# Patient Record
Sex: Female | Born: 1960 | Race: Black or African American | Hispanic: No | Marital: Single | Smoking: Current every day smoker
Health system: Southern US, Community
[De-identification: ages and names within clinical notes are randomized; demographics above are authoritative.]

## PROBLEM LIST (undated history)

## (undated) DIAGNOSIS — Z9889 Other specified postprocedural states: Secondary | ICD-10-CM

## (undated) DIAGNOSIS — R112 Nausea with vomiting, unspecified: Secondary | ICD-10-CM

## (undated) DIAGNOSIS — I10 Essential (primary) hypertension: Secondary | ICD-10-CM

## (undated) DIAGNOSIS — E7800 Pure hypercholesterolemia, unspecified: Secondary | ICD-10-CM

## (undated) HISTORY — PX: CHOLECYSTECTOMY: SHX55

## (undated) HISTORY — PX: ABDOMINAL HYSTERECTOMY: SHX81

## (undated) HISTORY — PX: TONSILLECTOMY: SUR1361

---

## 2011-08-20 ENCOUNTER — Emergency Department: Payer: Self-pay | Admitting: Emergency Medicine

## 2011-10-08 ENCOUNTER — Emergency Department: Payer: Self-pay | Admitting: Emergency Medicine

## 2011-10-08 LAB — CBC WITH DIFFERENTIAL/PLATELET
Basophil %: 0.6 %
Eosinophil #: 0.2 10*3/uL (ref 0.0–0.7)
Eosinophil %: 4.1 %
HCT: 39.7 % (ref 35.0–47.0)
HGB: 13.3 g/dL (ref 12.0–16.0)
Lymphocyte #: 1.1 10*3/uL (ref 1.0–3.6)
MCH: 29.8 pg (ref 26.0–34.0)
MCHC: 33.5 g/dL (ref 32.0–36.0)
MCV: 89 fL (ref 80–100)
Monocyte #: 1.1 x10 3/mm — ABNORMAL HIGH (ref 0.2–0.9)
Neutrophil #: 2.8 10*3/uL (ref 1.4–6.5)
Neutrophil %: 53.4 %
Platelet: 239 10*3/uL (ref 150–440)
RBC: 4.46 10*6/uL (ref 3.80–5.20)
RDW: 13.1 % (ref 11.5–14.5)

## 2011-10-08 LAB — COMPREHENSIVE METABOLIC PANEL
Albumin: 3.9 g/dL (ref 3.4–5.0)
Anion Gap: 9 (ref 7–16)
BUN: 12 mg/dL (ref 7–18)
Bilirubin,Total: 0.6 mg/dL (ref 0.2–1.0)
Co2: 23 mmol/L (ref 21–32)
Creatinine: 0.74 mg/dL (ref 0.60–1.30)
EGFR (African American): >60
Glucose: 93 mg/dL (ref 65–99)
Potassium: 3.5 mmol/L (ref 3.5–5.1)
SGPT (ALT): 33 U/L

## 2012-02-19 ENCOUNTER — Emergency Department: Payer: Self-pay | Admitting: Emergency Medicine

## 2015-02-05 ENCOUNTER — Ambulatory Visit
Admission: RE | Admit: 2015-02-05 | Discharge: 2015-02-05 | Disposition: A | Discharge status: Home / Self Care | Payer: Self-pay | Source: Ambulatory Visit | Attending: Oncology | Admitting: Oncology

## 2015-02-05 ENCOUNTER — Ambulatory Visit: Payer: Self-pay | Attending: Oncology

## 2015-02-05 VITALS — BP 132/88 | HR 78 | Temp 94.6°F | Resp 18 | Ht 63.0 in | Wt 160.2 lb

## 2015-02-05 DIAGNOSIS — Z0000 Encounter for general adult medical examination without abnormal findings: Secondary | ICD-10-CM

## 2015-02-05 NOTE — Progress Notes (Signed)
Subjective:     Patient ID: Teresa Vaughn, female   DOB: 31-Jan-1961, 54 y.o.   MRN: [id number]  HPI   Review of Systems     Objective:   Physical Exam  Pulmonary/Chest: Right breast exhibits no inverted nipple, no mass, no nipple discharge, no skin change and no tenderness. Left breast exhibits no inverted nipple, no mass, no nipple discharge, no skin change and no tenderness. Breasts are symmetrical.       Assessment:     54 year old patient presents for [facility] clinic visitPatient screened, and meets [facility] eligibility.  Patient does not have insurance, Medicare or Medicaid.  Handout given on Affordable Care Act.CBE unremarkable.  Instructed patient on breast self-exam using teach back method.    Plan:    Sent for bilateral screening mammogram.

## 2015-02-05 NOTE — Progress Notes (Signed)
Patient ID: Teresa Vaughn, female   DOB: 18-Jul-1960, 54 y.o.   MRN: [id number] Letter mailed to patient from [facility] to notify of normal mammogram results. Patient to return in 1 year for annual screening. Copy to [facility]

## 2016-01-28 ENCOUNTER — Emergency Department: Payer: Managed Care, Other (non HMO)

## 2016-01-28 ENCOUNTER — Emergency Department
Admission: EM | Admit: 2016-01-28 | Discharge: 2016-01-28 | Disposition: A | Discharge status: Home / Self Care | Payer: Managed Care, Other (non HMO) | Attending: Student in an Organized Health Care Education/Training Program | Admitting: Student in an Organized Health Care Education/Training Program

## 2016-01-28 DIAGNOSIS — M79671 Pain in right foot: Secondary | ICD-10-CM | POA: Insufficient documentation

## 2016-01-28 DIAGNOSIS — F17210 Nicotine dependence, cigarettes, uncomplicated: Secondary | ICD-10-CM | POA: Insufficient documentation

## 2016-01-28 MED ORDER — NAPROXEN 500 MG PO TABS
500.0000 mg | ORAL_TABLET | Freq: Once | ORAL | Status: DC
  Filled 2016-01-28: qty 1

## 2016-01-28 MED ORDER — NAPROXEN 500 MG PO TABS: 500.0000 mg | ORAL_TABLET | Freq: Two times a day (BID) | ORAL | 0 refills | Status: DC

## 2016-01-28 MED ORDER — NAPROXEN 500 MG PO TABS
500.0000 mg | ORAL_TABLET | Freq: Once | ORAL | Status: AC
  Administered 2016-01-28: 500 mg via ORAL
  Filled 2016-01-28: qty 1

## 2016-01-28 NOTE — ED Triage Notes (Signed)
Pt reports she went to work today and then with no cause right ankle/foot began to hurt - pt denies swelling

## 2016-01-28 NOTE — ED Notes (Signed)
Pt reports right ankle/foot pain - denies any injury to area - states she went to work and foot/ankle just started hurting

## 2016-01-28 NOTE — Discharge Instructions (Signed)
Follow up with the podiatrist for symptoms that are not improving over the week. Wear the ACE wrap when out of bed. Return to the ER for symptoms that change or worsen if unable to schedule an appointment with primary care or the specialist.

## 2016-01-28 NOTE — ED Provider Notes (Signed)
[facility] Emergency Department Provider Note ____________________________________________  Time seen: Approximately 8:41 PM  I have reviewed the triage vital signs and the nursing notes.   HISTORY  Chief Complaint Ankle Pain    HPI Teresa Vaughn is a 55 y.o. female who presents to the emergency department for sudden onset of right foot pain. She denies injury. She has not taken any medications for the pain prior to arrival. History of pain in the foot.  History reviewed. No pertinent past medical history.  There are no active problems to display for this patient.   History reviewed. No pertinent surgical history.  Prior to Admission medications   Medication Sig Start Date End Date Taking? Authorizing Provider  atorvastatin (LIPITOR) 40 MG tablet Take 40 mg by mouth daily.    Historical Provider, MD  hydrochlorothiazide (HYDRODIURIL) 25 MG tablet Take 25 mg by mouth daily.    Historical Provider, MD  losartan (COZAAR) 100 MG tablet Take 100 mg by mouth daily.    Historical Provider, MD  naproxen (NAPROSYN) 500 MG tablet Take 1 tablet (500 mg total) by mouth 2 (two) times daily with a meal. 01/28/16   Kess Mcilwain B [name], FNP  triamcinolone (KENALOG) 0.025 % cream Apply 1 application topically 2 (two) times daily.    Historical Provider, MD    Allergies Lisinopril and Penicillins  Family History  Problem Relation Age of Onset  . Breast cancer Paternal Aunt 735    Social History Social History  Substance Use Topics  . Smoking status: Current Every Day Smoker    Packs/day: 0.50    Types: Cigarettes    Start date: 07/07/1989  . Smokeless tobacco: Never Used  . Alcohol use 4.2 oz/week    7 Standard drinks or equivalent per week    Review of Systems Constitutional: No recent illness. Cardiovascular: Denies chest pain or palpitations. Respiratory: Denies shortness of breath. Musculoskeletal: Pain in Right foot Skin: Negative for rash, wound,  lesion. Neurological: Negative for focal weakness or numbness.  ____________________________________________   PHYSICAL EXAM:  VITAL SIGNS: ED Triage Vitals [01/28/16 2026]  Enc Vitals Group     BP (!) 224/115     Pulse Rate 100     Resp 16     Temp 98 F (36.7 C)     Temp Source Oral     SpO2 98 %     Weight 150 lb (68 kg)     Height 5\' 3"  (1.6 m)     Head Circumference      Peak Flow      Pain Score 8     Pain Loc      Pain Edu?      Excl. in GC?     Constitutional: Alert and oriented. Well appearing and in no acute distress. Eyes: Conjunctivae are normal. EOMI. Head: Atraumatic. Neck: No stridor.  Respiratory: Normal respiratory effort.   Musculoskeletal: Tenderness to palpation over the dorsal aspect of the right foot near the third and fourth metatarsals. No deformity or swelling. Neurologic:  Normal speech and language. No gross focal neurologic deficits are appreciated. Speech is normal. No gait instability. Skin:  Skin is warm, dry and intact. Atraumatic. Psychiatric: Mood and affect are normal. Speech and behavior are normal.  ____________________________________________   LABS (all labs ordered are listed, but only abnormal results are displayed)  Labs Reviewed - No data to display ____________________________________________  RADIOLOGY  Right foot negative for acute bony abnormality per radiology. ____________________________________________  PROCEDURES  Procedure(s) performed: Ace bandage applied to the right foot and ankle by ER tech. Patient was neurovascularly intact post-application.   ____________________________________________   INITIAL IMPRESSION / ASSESSMENT AND PLAN / ED COURSE  Pertinent labs & imaging results that were available during my care of the patient were reviewed by me and considered in my medical decision making (see chart for details).  Patient was instructed to follow-up with podiatry for symptoms that are not  improving over the next few days. She was instructed to take Naprosyn as prescribed. She is instructed to return to the emergency department for symptoms that change or worsen if she is unable schedule an appointment. ____________________________________________   FINAL CLINICAL IMPRESSION(S) / ED DIAGNOSES  Final diagnoses:  Acute foot pain, right       Chinita PesterCari B Yovan Leeman, FNP 01/28/16 [id number]    Willy EddyPatrick Robinson, MD 01/28/16 2359

## 2016-07-23 ENCOUNTER — Ambulatory Visit
Admission: EM | Admit: 2016-07-23 | Discharge: 2016-07-23 | Disposition: A | Discharge status: Home / Self Care | Payer: Managed Care, Other (non HMO) | Attending: Family Medicine | Admitting: Family Medicine

## 2016-07-23 ENCOUNTER — Encounter: Payer: Self-pay | Admitting: Emergency Medicine

## 2016-07-23 DIAGNOSIS — R030 Elevated blood-pressure reading, without diagnosis of hypertension: Secondary | ICD-10-CM

## 2016-07-23 DIAGNOSIS — J40 Bronchitis, not specified as acute or chronic: Secondary | ICD-10-CM

## 2016-07-23 DIAGNOSIS — J0100 Acute maxillary sinusitis, unspecified: Secondary | ICD-10-CM | POA: Diagnosis not present

## 2016-07-23 HISTORY — DX: Essential (primary) hypertension: I10

## 2016-07-23 MED ORDER — HYDROCOD POLST-CPM POLST ER 10-8 MG/5ML PO SUER: 5.0000 mL | Freq: Every evening | ORAL | 0 refills | Status: DC | PRN

## 2016-07-23 MED ORDER — CLONIDINE HCL 0.1 MG PO TABS
0.1000 mg | ORAL_TABLET | Freq: Once | ORAL | Status: AC
  Administered 2016-07-23: 0.1 mg via ORAL

## 2016-07-23 MED ORDER — DOXYCYCLINE HYCLATE 100 MG PO CAPS: 100.0000 mg | ORAL_CAPSULE | Freq: Two times a day (BID) | ORAL | 0 refills | Status: DC

## 2016-07-23 NOTE — ED Triage Notes (Signed)
Patient c/o cough, chest congestion, bodyaches and HAs that started last Tuesday.

## 2016-07-23 NOTE — ED Provider Notes (Signed)
[facility] ____________________________________________  Time seen: Approximately 11:13 AM  I have reviewed the triage vital signs and the nursing notes.   HISTORY  Chief Complaint Cough; Fever; and Headache   HPI Teresa Vaughn is a 56 y.o. female presents for complaints of 8-9 days of runny nose, nasal congestion, cough.  Patient reports that initial symptom onset she had accompanying fever for the first day. Patient reports that her granddaughter had had similar symptoms prior to her symptom onset. Patient reports she has intermittently taken over-the-counter Coricidin without resolution of symptoms. States cough is often a dry hacking cough. Patient reports she has had continued sinus pressure and sinus congestion. Patient reports intermittently producing thick greenish mucus with cough as well as nose. Reports associated postnasal drainage. Denies any recent fevers. Reports continues to drink fluids well, slight decrease in appetite. Patient reports has continued to work everyday. 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   Patient reports that she has taken her daily blood pressure medicine today. Patient reports that she was last seen by her primary care physician in August of this past year, reports normal blood work and instructed to continue her current blood pressure medicine. Denies chest pain, headaches, dizziness, vision changes, chest pain with deep breath, shortness of breath, abdominal pain, dysuria, extremity pain, extremity swelling or rash. Denies recent sickness. Denies recent antibiotic use.   PCP : [facility]   Past Medical History:  Diagnosis Date  . Hypertension     There are no active problems to display for this patient.   Past Surgical History:  Procedure Laterality Date  . ABDOMINAL HYSTERECTOMY    . TONSILLECTOMY       No current facility-administered medications for this encounter.   Current  Outpatient Prescriptions:  .  losartan (COZAAR) 50 MG tablet, Take 50 mg by mouth daily., Disp: , Rfl:  .  atorvastatin (LIPITOR) 40 MG tablet, Take 40 mg by mouth daily., Disp: , Rfl:  .  chlorpheniramine-HYDROcodone (TUSSIONEX PENNKINETIC ER) 10-8 MG/5ML SUER, Take 5 mLs by mouth at bedtime as needed for cough. do not drive or operate machinery while taking as can cause drowsiness., Disp: 75 mL, Rfl: 0 .  doxycycline (VIBRAMYCIN) 100 MG capsule, Take 1 capsule (100 mg total) by mouth 2 (two) times daily., Disp: 20 capsule, Rfl: 0  Allergies Lisinopril and Penicillins  Family History  Problem Relation Age of Onset  . Breast cancer Paternal Aunt 24  Mother: HTN Father: HTN  Social History Social History  Substance Use Topics  . Smoking status: Current Every Day Smoker    Packs/day: 0.50    Types: Cigarettes    Start date: 07/07/1989  . Smokeless tobacco: Never Used  . Alcohol use 4.2 oz/week    7 Standard drinks or equivalent per week    Review of Systems Constitutional: As above.  Eyes: No visual changes. ENT: No sore throat. Cardiovascular: Denies chest pain. Respiratory: Denies shortness of breath. Gastrointestinal: No abdominal pain.  No nausea, no vomiting.  No diarrhea.  No constipation. Genitourinary: Negative for dysuria. Musculoskeletal: Negative for back pain. Skin: Negative for rash. Neurological: Negative for headaches, focal weakness or numbness.  10-point ROS otherwise negative.  ____________________________________________   PHYSICAL EXAM:  VITAL SIGNS: ED Triage Vitals  Enc Vitals Group     BP 07/23/16 0958 (S) (!) 194/99     Pulse Rate 07/23/16 0958 71     Resp 07/23/16 0958 16  Temp 07/23/16 0958 98.2 F (36.8 C)     Temp Source 07/23/16 0958 Oral     SpO2 07/23/16 0958 99 %     Weight 07/23/16 0954 150 lb (68 kg)     Height 07/23/16 0954 5\' 3"  (1.6 m)     Head Circumference --      Peak Flow --      Pain Score 07/23/16 0957 0     Pain  Loc --      Pain Edu? --      Excl. in GC? --    Vitals:   07/23/16 1203 07/23/16 1222 07/23/16 1251 07/23/16 1314  BP: (!) 196/101 (!) 186/108 (!) 173/88 (!) 172/74  Pulse: 71  (!) 59   Resp:      Temp:      TempSrc:      SpO2:      Weight:      Height:        Constitutional: Alert and oriented. Well appearing and in no acute distress. Eyes: Conjunctivae are normal. PERRL. EOMI. Head: Atraumatic.Mild to moderate tenderness to palpation bilateral maxillary sinuses, no frontal sinus tenderness to palpation. No swelling. No erythema.   Ears: no erythema, normal TMs bilaterally.   Nose: nasal congestion with bilateral nasal turbinate erythema and edema.   Mouth/Throat: Mucous membranes are moist.  Oropharynx non-erythematous.No tonsillar swelling or exudate.  Neck: No stridor.  No cervical spine tenderness to palpation. Hematological/Lymphatic/Immunilogical: No cervical lymphadenopathy. Cardiovascular: Normal rate, regular rhythm. Grossly normal heart sounds.  Good peripheral circulation. Respiratory: Normal respiratory effort.  No retractions. No wheezes, rales or rhonchi. Good air movement. Dry intermittent cough noted in room.  Gastrointestinal: Soft and nontender. No distention. No CVA tenderness. Musculoskeletal: No lower or upper extremity tenderness nor edema. Ambulatory with steady gait. No cervical, thoracic or lumbar tenderness to palpation.  Neurologic:  Normal speech and language. No gross focal neurologic deficits are appreciated. No gait instability. Skin:  Skin is warm, dry and intact. No rash noted. Psychiatric: Mood and affect are normal. Speech and behavior are normal.  ___________________________________________   LABS (all labs ordered are listed, but only abnormal results are displayed)  Labs Reviewed - No data to display   PROCEDURES Procedures    INITIAL IMPRESSION / ASSESSMENT AND PLAN / ED COURSE  Pertinent labs & imaging results that were available  during my care of the patient were reviewed by me and considered in my medical decision making (see chart for details).  Well-appearing patient. No acute distress. Suspect associated sinusitis and bronchitis, and discussed the patient may be secondary to post influenza. Will treat patient with oral doxycycline and when necessary Tussionex. Encouraged supportive care, rest, fluids. Return if given for today and tomorrow.  Patient noted to be hypertensive in urgent care. Patient reports that she has taken her daily medication today. If a discussed with patient, patient reports some confusion in regards to what medicine she is supposed to be taking. Patient states that her last primary doctor's appointment she thought the doctor was going to be starting her on additional blood pressure medication, but states no medicine changes have been made. Verified with patient she is taking daily Cozaar 50 mg. Patient denies taking frequent over-the-counter medications, and states the only cough congestion that he should she took was Coricidin for blood pressure. Denies recent dietary changes or increased caffeine intake. Patient denies any other symptoms. Clonidine 0.1 mg given.  1220: After second dose of clonidine, patient blood pressure  improving. Patient sitting in room and reports that she feels well. Denies any chest pain, shortness of breath, dizziness, vision changes, weakness or other complaints. Discussed in detail with patient need to follow-up closely with her primary care physician for blood pressure management. Patient states that she will call today to schedule appointment in the next 2 days. Discussed strict follow-up and return parameters. Discussed taking medication as prescribed. Discussed indication, risks and benefits of medications with patient.  Discussed follow up with Primary care physician this week. Discussed follow up and return parameters including no resolution or any worsening concerns.  Patient verbalized understanding and agreed to plan.   ____________________________________________   FINAL CLINICAL IMPRESSION(S) / ED DIAGNOSES  Final diagnoses:  Acute maxillary sinusitis, recurrence not specified  Bronchitis  Elevated blood pressure reading     Discharge Medication List as of 07/23/2016 11:19 AM    START taking these medications   Details  chlorpheniramine-HYDROcodone (TUSSIONEX PENNKINETIC ER) 10-8 MG/5ML SUER Take 5 mLs by mouth at bedtime as needed for cough. do not drive or operate machinery while taking as can cause drowsiness., Starting Wed 07/23/2016, Print    doxycycline (VIBRAMYCIN) 100 MG capsule Take 1 capsule (100 mg total) by mouth 2 (two) times daily., Starting Wed 07/23/2016, Normal        Note: This dictation was prepared with Dragon dictation along with smaller phrase technology. Any transcriptional errors that result from this process are unintentional.         Renford Dills, NP 07/23/16 1354    Renford Dills, NP 07/23/16 1354

## 2016-07-23 NOTE — Discharge Instructions (Signed)
Take medication as prescribed. Rest. Drink plenty of fluids.  ° °Follow up with your primary care physician this week as needed. Return to Urgent care for new or worsening concerns.  ° °

## 2016-08-18 ENCOUNTER — Other Ambulatory Visit: Payer: Self-pay | Admitting: Internal Medicine

## 2016-08-18 DIAGNOSIS — Z1231 Encounter for screening mammogram for malignant neoplasm of breast: Secondary | ICD-10-CM

## 2016-09-18 ENCOUNTER — Ambulatory Visit
Admission: RE | Admit: 2016-09-18 | Discharge: 2016-09-18 | Disposition: A | Discharge status: Home / Self Care | Payer: Managed Care, Other (non HMO) | Source: Ambulatory Visit | Attending: Internal Medicine | Admitting: Internal Medicine

## 2016-09-18 DIAGNOSIS — Z1231 Encounter for screening mammogram for malignant neoplasm of breast: Secondary | ICD-10-CM | POA: Diagnosis not present

## 2017-03-23 ENCOUNTER — Other Ambulatory Visit: Payer: Self-pay | Admitting: Internal Medicine

## 2017-03-23 DIAGNOSIS — R945 Abnormal results of liver function studies: Secondary | ICD-10-CM

## 2017-07-16 ENCOUNTER — Other Ambulatory Visit: Payer: Self-pay | Admitting: Internal Medicine

## 2017-07-16 DIAGNOSIS — Z1231 Encounter for screening mammogram for malignant neoplasm of breast: Secondary | ICD-10-CM

## 2017-10-09 ENCOUNTER — Ambulatory Visit
Admission: RE | Admit: 2017-10-09 | Discharge: 2017-10-09 | Disposition: A | Discharge status: Home / Self Care | Payer: Managed Care, Other (non HMO) | Source: Ambulatory Visit | Attending: Internal Medicine | Admitting: Internal Medicine

## 2017-10-09 DIAGNOSIS — Z1231 Encounter for screening mammogram for malignant neoplasm of breast: Secondary | ICD-10-CM | POA: Insufficient documentation

## 2017-10-09 DIAGNOSIS — R928 Other abnormal and inconclusive findings on diagnostic imaging of breast: Secondary | ICD-10-CM | POA: Insufficient documentation

## 2017-10-13 ENCOUNTER — Other Ambulatory Visit: Payer: Self-pay | Admitting: Internal Medicine

## 2017-10-13 DIAGNOSIS — N6489 Other specified disorders of breast: Secondary | ICD-10-CM

## 2017-10-13 DIAGNOSIS — R928 Other abnormal and inconclusive findings on diagnostic imaging of breast: Secondary | ICD-10-CM

## 2017-11-02 ENCOUNTER — Ambulatory Visit
Admission: RE | Admit: 2017-11-02 | Discharge: 2017-11-02 | Disposition: A | Discharge status: Home / Self Care | Payer: Managed Care, Other (non HMO) | Source: Ambulatory Visit | Attending: Internal Medicine | Admitting: Internal Medicine

## 2017-11-02 DIAGNOSIS — N6489 Other specified disorders of breast: Secondary | ICD-10-CM | POA: Diagnosis present

## 2017-11-02 DIAGNOSIS — R928 Other abnormal and inconclusive findings on diagnostic imaging of breast: Secondary | ICD-10-CM | POA: Insufficient documentation

## 2017-11-11 ENCOUNTER — Ambulatory Visit
Admission: RE | Admit: 2017-11-11 | Discharge: 2017-11-11 | Disposition: A | Discharge status: Home / Self Care | Payer: Managed Care, Other (non HMO) | Source: Ambulatory Visit | Attending: Internal Medicine | Admitting: Internal Medicine

## 2017-11-11 DIAGNOSIS — K8020 Calculus of gallbladder without cholecystitis without obstruction: Secondary | ICD-10-CM | POA: Diagnosis not present

## 2017-11-11 DIAGNOSIS — I10 Essential (primary) hypertension: Secondary | ICD-10-CM | POA: Insufficient documentation

## 2017-11-11 DIAGNOSIS — R945 Abnormal results of liver function studies: Secondary | ICD-10-CM | POA: Diagnosis present

## 2017-11-11 DIAGNOSIS — K760 Fatty (change of) liver, not elsewhere classified: Secondary | ICD-10-CM | POA: Insufficient documentation

## 2017-11-11 DIAGNOSIS — E781 Pure hyperglyceridemia: Secondary | ICD-10-CM | POA: Insufficient documentation

## 2018-01-04 ENCOUNTER — Encounter: Payer: Self-pay | Admitting: Gynecology

## 2018-01-04 ENCOUNTER — Other Ambulatory Visit: Payer: Self-pay

## 2018-01-04 ENCOUNTER — Ambulatory Visit
Admission: EM | Admit: 2018-01-04 | Discharge: 2018-01-04 | Disposition: A | Discharge status: Home / Self Care | Payer: Managed Care, Other (non HMO)

## 2018-01-04 DIAGNOSIS — R21 Rash and other nonspecific skin eruption: Secondary | ICD-10-CM | POA: Diagnosis not present

## 2018-01-04 HISTORY — DX: Pure hypercholesterolemia, unspecified: E78.00

## 2018-01-04 MED ORDER — PREDNISONE 10 MG PO TABS: ORAL_TABLET | ORAL | 0 refills | Status: DC

## 2018-01-04 NOTE — ED Provider Notes (Signed)
[facility] ____________________________________________  Time seen: Approximately 9:17 AM  I have reviewed the triage vital signs and the nursing notes.   HISTORY  Chief Complaint Rash  HPI Teresa Vaughn is a 57 y.o. female presenting for evaluation of rash that has been present for 2 months.  Patient reports rash is non-itchy, has been in varying locations of bilateral arms and bilateral lower legs.  Patient reports that pain rash intermittently improves, resolves and then changes locations.  States never has any rash to torso or underneath clothing.  Denies known trigger.  Denies any changes at home in foods, medicines, lotions, detergents or other contacts.  States that she does wear a T-shirt and shorts to work, with skin exposure consistent to rash area.  Denies known insect bite.  Denies others at home with similar.  States the rash is not itchy or painful.  No rash to torso, face, palms or soles of feet.  Unresolved with topical hydrocortisone cream.  Denies other aggravating alleviating factors.  Worse otherwise feels well.  Denies other complaints.Denies recent sickness. Denies recent antibiotic use.  Denies diabetes.  Denies renal insufficiency.  Denies cardiac history.  Barbette Reichmann, MD: PCP   Past Medical History:  Diagnosis Date  . Hypercholesteremia   . Hypertension     There are no active problems to display for this patient.   Past Surgical History:  Procedure Laterality Date  . ABDOMINAL HYSTERECTOMY    . TONSILLECTOMY       No current facility-administered medications for this encounter.   Current Outpatient Medications:  .  amLODipine (NORVASC) 5 MG tablet, Take by mouth., Disp: , Rfl:  .  atorvastatin (LIPITOR) 40 MG tablet, Take 40 mg by mouth daily., Disp: , Rfl:  .  losartan-hydrochlorothiazide (HYZAAR) 100-25 MG tablet, TAKE 1 TABLET BY MOUTH ONCE DAILY., Disp: , Rfl: 5 .  vitamin B-12 (CYANOCOBALAMIN) 1000 MCG tablet, Take by  mouth., Disp: , Rfl:  .  vitamin E 200 UNIT capsule, Take by mouth., Disp: , Rfl:  .  predniSONE (DELTASONE) 10 MG tablet, Take 60mg  orally day one, then 55 mg orally day two, then 50 mg orally day three, then taper by 5 mg daily over 12 days until complete., Disp: 35 tablet, Rfl: 0  Allergies Lisinopril and Penicillins  Family History  Problem Relation Age of Onset  . Breast cancer Paternal Aunt 32    Social History Social History   Tobacco Use  . Smoking status: Current Every Day Smoker    Packs/day: 0.50    Types: Cigarettes    Start date: 07/07/1989  . Smokeless tobacco: Never Used  Substance Use Topics  . Alcohol use: Yes    Alcohol/week: 4.2 oz    Types: 7 Standard drinks or equivalent per week  . Drug use: Never    Review of Systems Constitutional: No fever/chills Cardiovascular: Denies chest pain. Respiratory: Denies shortness of breath. Gastrointestinal: No abdominal pain. Musculoskeletal: Negative for back pain. Skin: positive rash  ____________________________________________   PHYSICAL EXAM:  VITAL SIGNS: ED Triage Vitals [01/04/18 0814]  Enc Vitals Group     BP (!) 143/84     Pulse Rate 94     Resp 16     Temp 98.3 F (36.8 C)     Temp Source Oral     SpO2 99 %     Weight 170 lb (77.1 kg)     Height 5\' 3"  (1.6 m)     Head Circumference  Peak Flow      Pain Score 0     Pain Loc      Pain Edu?      Excl. in GC?     Constitutional: Alert and oriented. Well appearing and in no acute distress. ENT      Head: Normocephalic and atraumatic. Cardiovascular: Normal rate, regular rhythm. Grossly normal heart sounds.  Good peripheral circulation. Respiratory: Normal respiratory effort without tachypnea nor retractions. Breath sounds are clear and equal bilaterally. No wheezes, rales, rhonchi. Musculoskeletal:  No midline cervical, thoracic or lumbar tenderness to palpation.       Right lower leg:  No tenderness or edema.      Left lower leg:  No  tenderness or edema.  Neurologic:  Normal speech and language. Speech is normal. No gait instability.  Skin:  Skin is warm, dry. Except: Bilateral lower extremities and bilateral arms in T-shirt and short pattern scattered areas of slightly erythematous papular rash some with excoriation, nonvesicular, no drainage, no surrounding erythema, no induration, nontender no edema noted. Psychiatric: Mood and affect are normal. Speech and behavior are normal. Patient exhibits appropriate insight and judgment   ___________________________________________   LABS (all labs ordered are listed, but only abnormal results are displayed)  Labs Reviewed - No data to display   PROCEDURES Procedures  ____________________________________   INITIAL IMPRESSION / ASSESSMENT AND PLAN / ED COURSE  Pertinent labs & imaging results that were available during my care of the patient were reviewed by me and considered in my medical decision making (see chart for details).  Well-appearing patient.  No acute distress.  Presented for evaluation of rash with change in location of the last few months.  Suspect contact dermatitis rash.  Suspect related to exposures at work.  Encouraged to continue to monitor for triggers.  Will treat with prednisone taper and supportive care.  Discussed follow-up with dermatology for continued complaints.Discussed indication, risks and benefits of medications with patient.  Discussed follow up with Primary care physician this week as needed. Discussed follow up and return parameters including no resolution or any worsening concerns. Patient verbalized understanding and agreed to plan.   ____________________________________________   FINAL CLINICAL IMPRESSION(S) / ED DIAGNOSES  Final diagnoses:  Rash     ED Discharge Orders        Ordered    predniSONE (DELTASONE) 10 MG tablet     01/04/18 [id number]       Note: This dictation was prepared with Dragon dictation along with smaller  phrase technology. Any transcriptional errors that result from this process are unintentional.         Renford DillsMiller, Rowe Warman, NP 01/04/18 1120

## 2018-01-04 NOTE — Discharge Instructions (Signed)
Take medication as prescribed. Rest. Drink plenty of fluids. Monitor for trigger.   Follow up with dermatology as discussed.   Follow up with your primary care physician this week as needed. Return to Urgent care for new or worsening concerns.

## 2018-01-04 NOTE — ED Triage Notes (Signed)
Patient c/o rash on skin over 2 months. Patient deny any fever /nausea / or vomiting.

## 2018-01-29 ENCOUNTER — Emergency Department
Admission: EM | Admit: 2018-01-29 | Discharge: 2018-01-29 | Disposition: A | Discharge status: Home / Self Care | Payer: Managed Care, Other (non HMO) | Attending: Emergency Medicine | Admitting: Emergency Medicine

## 2018-01-29 DIAGNOSIS — F17210 Nicotine dependence, cigarettes, uncomplicated: Secondary | ICD-10-CM | POA: Insufficient documentation

## 2018-01-29 DIAGNOSIS — I10 Essential (primary) hypertension: Secondary | ICD-10-CM | POA: Insufficient documentation

## 2018-01-29 DIAGNOSIS — R42 Dizziness and giddiness: Secondary | ICD-10-CM

## 2018-01-29 DIAGNOSIS — Z79899 Other long term (current) drug therapy: Secondary | ICD-10-CM | POA: Diagnosis not present

## 2018-01-29 DIAGNOSIS — R51 Headache: Secondary | ICD-10-CM | POA: Diagnosis not present

## 2018-01-29 LAB — BASIC METABOLIC PANEL
Anion gap: 7 (ref 5–15)
BUN: 13 mg/dL (ref 6–20)
CO2: 27 mmol/L (ref 22–32)
Calcium: 9.4 mg/dL (ref 8.9–10.3)
Chloride: 102 mmol/L (ref 98–111)
Creatinine, Ser: 0.9 mg/dL (ref 0.44–1.00)
GFR calc Af Amer: >60 mL/min (ref >60)
Glucose, Bld: 97 mg/dL (ref 70–99)
POTASSIUM: 3.4 mmol/L — AB (ref 3.5–5.1)
SODIUM: 136 mmol/L (ref 135–145)

## 2018-01-29 LAB — URINALYSIS, COMPLETE (UACMP) WITH MICROSCOPIC
Bilirubin Urine: NEGATIVE
GLUCOSE, UA: NEGATIVE mg/dL
Hgb urine dipstick: NEGATIVE
KETONES UR: NEGATIVE mg/dL
LEUKOCYTES UA: NEGATIVE
Nitrite: NEGATIVE
PH: 7 (ref 5.0–8.0)
Protein, ur: NEGATIVE mg/dL
SPECIFIC GRAVITY, URINE: 1.019 (ref 1.005–1.030)

## 2018-01-29 LAB — CBC
HEMATOCRIT: 42 % (ref 35.0–47.0)
HEMOGLOBIN: 14.7 g/dL (ref 12.0–16.0)
MCH: 32.6 pg (ref 26.0–34.0)
MCHC: 34.9 g/dL (ref 32.0–36.0)
MCV: 93.4 fL (ref 80.0–100.0)
Platelets: 209 10*3/uL (ref 150–440)
RBC: 4.5 MIL/uL (ref 3.80–5.20)
RDW: 12.3 % (ref 11.5–14.5)
WBC: 6.9 10*3/uL (ref 3.6–11.0)

## 2018-01-29 MED ORDER — SODIUM CHLORIDE 0.9 % IV BOLUS
1000.0000 mL | Freq: Once | INTRAVENOUS | Status: AC
  Administered 2018-01-29: 1000 mL via INTRAVENOUS

## 2018-01-29 MED ORDER — ONDANSETRON HCL 4 MG/2ML IJ SOLN
INTRAMUSCULAR | Status: AC
  Filled 2018-01-29: qty 2

## 2018-01-29 MED ORDER — MECLIZINE HCL 25 MG PO TABS
25.0000 mg | ORAL_TABLET | Freq: Once | ORAL | Status: AC
  Administered 2018-01-29: 25 mg via ORAL

## 2018-01-29 MED ORDER — MECLIZINE HCL 25 MG PO TABS: 25.0000 mg | ORAL_TABLET | Freq: Three times a day (TID) | ORAL | 0 refills | Status: DC | PRN

## 2018-01-29 MED ORDER — ONDANSETRON HCL 4 MG PO TABS: 4.0000 mg | ORAL_TABLET | Freq: Three times a day (TID) | ORAL | 0 refills | Status: DC | PRN

## 2018-01-29 MED ORDER — ONDANSETRON HCL 4 MG/2ML IJ SOLN
4.0000 mg | Freq: Once | INTRAMUSCULAR | Status: AC
  Administered 2018-01-29: 4 mg via INTRAVENOUS

## 2018-01-29 MED ORDER — MECLIZINE HCL 25 MG PO TABS
ORAL_TABLET | ORAL | Status: AC
  Filled 2018-01-29: qty 1

## 2018-01-29 NOTE — Discharge Instructions (Addendum)
Please seek medical attention for any high fevers, chest pain, shortness of breath, change in behavior, persistent vomiting, bloody stool or any other new or concerning symptoms.  

## 2018-01-29 NOTE — ED Triage Notes (Signed)
Patient c/o headache when she changes positions. Patient c/o weakness and dizziness. Patient denies N/V, visual changes, photosensitivity.

## 2018-01-29 NOTE — ED Provider Notes (Signed)
[facility] Emergency Department Provider Note  ____________________________________________   I have reviewed the triage vital signs and the nursing notes.   HISTORY  Chief Complaint Dizziness  History limited by: Not Limited   HPI Teresa Vaughn is a 57 y.o. female who presents to the emergency department today because of concerns for dizziness.  Patient describes as a sense of feeling lightheaded.  She noticed it starting yesterday.  It is worse when she moves.  This could be moving side to side of the bed or standing up.  She is this has been accompanied by some head ache.  Located primarily in the back of her head.  Patient has had fevers.  T-max of 101.  She denies any nausea or vomiting.  Denies similar symptoms in the past.   Per medical record review patient has a history of hld, htn.  Past Medical History:  Diagnosis Date  . Hypercholesteremia   . Hypertension     There are no active problems to display for this patient.   Past Surgical History:  Procedure Laterality Date  . ABDOMINAL HYSTERECTOMY    . TONSILLECTOMY      Prior to Admission medications   Medication Sig Start Date End Date Taking? Authorizing Provider  amLODipine (NORVASC) 5 MG tablet Take by mouth. 08/24/17 08/24/18  [provider]  atorvastatin (LIPITOR) 40 MG tablet Take 40 mg by mouth daily.    [provider]  losartan-hydrochlorothiazide (HYZAAR) 100-25 MG tablet TAKE 1 TABLET BY MOUTH ONCE DAILY. 10/22/17   [provider]  predniSONE (DELTASONE) 10 MG tablet Take 60mg  orally day one, then 55 mg orally day two, then 50 mg orally day three, then taper by 5 mg daily over 12 days until complete. 01/04/18   Renford DillsMiller, Lindsey, NP  vitamin B-12 (CYANOCOBALAMIN) 1000 MCG tablet Take by mouth.    [provider]  vitamin E 200 UNIT capsule Take by mouth.    [provider]    Allergies Lisinopril and Penicillins  Family History   Problem Relation Age of Onset  . Breast cancer Paternal Aunt [id number]    Social History Social History   Tobacco Use  . Smoking status: Current Every Day Smoker    Packs/day: 0.50    Types: Cigarettes    Start date: 07/07/1989  . Smokeless tobacco: Never Used  Substance Use Topics  . Alcohol use: Yes    Alcohol/week: 7.0 standard drinks    Types: 7 Standard drinks or equivalent per week  . Drug use: Never    Review of Systems Constitutional: No fever/chills Eyes: No visual changes. ENT: No sore throat. Cardiovascular: Denies chest pain. Respiratory: Denies shortness of breath. Gastrointestinal: No abdominal pain.  No nausea, no vomiting.  No diarrhea.   Genitourinary: Negative for dysuria. Musculoskeletal: Negative for back pain. Skin: Negative for rash. Neurological: Positive for headache and dizziness. ____________________________________________   PHYSICAL EXAM:  VITAL SIGNS: ED Triage Vitals  Enc Vitals Group     BP 01/29/18 0019 (!) 143/89     Pulse Rate 01/29/18 0019 98     Resp 01/29/18 0019 17     Temp 01/29/18 0019 98.7 F (37.1 C)     Temp Source 01/29/18 0019 Oral     SpO2 01/29/18 0019 100 %     Weight 01/29/18 0020 170 lb (77.1 kg)     Height 01/29/18 0020 5\' 3"  (1.6 m)     Head Circumference --  Peak Flow --      Pain Score 01/29/18 0020 0   Constitutional: Alert and oriented.  Eyes: Conjunctivae are normal.  ENT      Head: Normocephalic and atraumatic.      Nose: No congestion/rhinnorhea.      Mouth/Throat: Mucous membranes are moist.      Neck: No stridor. Hematological/Lymphatic/Immunilogical: No cervical lymphadenopathy. Cardiovascular: Normal rate, regular rhythm.  No murmurs, rubs, or gallops.  Respiratory: Normal respiratory effort without tachypnea nor retractions. Breath sounds are clear and equal bilaterally. No wheezes/rales/rhonchi. Gastrointestinal: Soft and non tender. No rebound. No guarding.  Genitourinary:  Deferred Musculoskeletal: Normal range of motion in all extremities. No lower extremity edema. Neurologic:  Normal speech and language. No gross focal neurologic deficits are appreciated.  Skin:  Skin is warm, dry and intact. No rash noted. Psychiatric: Mood and affect are normal. Speech and behavior are normal. Patient exhibits appropriate insight and judgment.  ____________________________________________    LABS (pertinent positives/negatives)  CBC wnl BMP wnl except k 3.4 UA rare bacteria otherwise unremarkable  ____________________________________________   EKG  I, Phineas SemenGraydon Shelle Galdamez, attending physician, personally viewed and interpreted this EKG  EKG Time: 0013 Rate: 103 Rhythm: sinus tachycardia Axis: normal Intervals: qtc 458 QRS: narrow ST changes: no st elevation Impression: sinus tachycardia otherwise normal ekg  ____________________________________________    RADIOLOGY  None  ____________________________________________   PROCEDURES  Procedures  ____________________________________________   INITIAL IMPRESSION / ASSESSMENT AND PLAN / ED COURSE  Pertinent labs & imaging results that were available during my care of the patient were reviewed by me and considered in my medical decision making (see chart for details).   Patient presented to the emergency department today with primary complaint of dizziness.  It is worse when she moves.  This point I think vertigo likely.  Patient did respond well to the meclizine.  Doubt central lesion given intermittent nature.  Think this could have been triggered by a viral illness given the fact the patient has had some fevers.  Discussed importance of primary care follow-up with patient.  ____________________________________________   FINAL CLINICAL IMPRESSION(S) / ED DIAGNOSES  Final diagnoses:  Vertigo     Note: This dictation was prepared with Dragon dictation. Any transcriptional errors that result from  this process are unintentional     Phineas SemenGoodman, Kaiyan Luczak, MD 01/29/18 0230

## 2018-08-10 ENCOUNTER — Emergency Department: Payer: BLUE CROSS/BLUE SHIELD

## 2018-08-10 ENCOUNTER — Other Ambulatory Visit: Payer: Self-pay

## 2018-08-10 ENCOUNTER — Encounter: Payer: Self-pay | Admitting: Emergency Medicine

## 2018-08-10 ENCOUNTER — Emergency Department
Admission: EM | Admit: 2018-08-10 | Discharge: 2018-08-10 | Disposition: A | Discharge status: Home / Self Care | Payer: BLUE CROSS/BLUE SHIELD | Attending: Emergency Medicine | Admitting: Emergency Medicine

## 2018-08-10 DIAGNOSIS — Z79899 Other long term (current) drug therapy: Secondary | ICD-10-CM | POA: Diagnosis not present

## 2018-08-10 DIAGNOSIS — F17210 Nicotine dependence, cigarettes, uncomplicated: Secondary | ICD-10-CM | POA: Insufficient documentation

## 2018-08-10 DIAGNOSIS — M79674 Pain in right toe(s): Secondary | ICD-10-CM | POA: Diagnosis present

## 2018-08-10 DIAGNOSIS — I10 Essential (primary) hypertension: Secondary | ICD-10-CM | POA: Insufficient documentation

## 2018-08-10 NOTE — ED Provider Notes (Signed)
[facility] Emergency Department Provider Note   ____________________________________________   First MD Initiated Contact with Patient 08/10/18 [phone]     (approximate)  I have reviewed the triage vital signs and the nursing notes.   HISTORY  Chief Complaint Toe Pain    HPI Teresa Vaughn is a 58 y.o. female patient reports about 2 or 3 weeks of pain and tenderness with walking in her second toe on the right foot.  Palpation of the toe does not produce any pain there is no redness or swelling in the toe palpation of the MTP joint of the toe on the plantar surface especially over the head of the metatarsal itself reproduces the pain.  There is no pain elsewhere in the foot to palpation.   Past Medical History:  Diagnosis Date  . Hypercholesteremia   . Hypertension     There are no active problems to display for this patient.   Past Surgical History:  Procedure Laterality Date  . ABDOMINAL HYSTERECTOMY    . TONSILLECTOMY      Prior to Admission medications   Medication Sig Start Date End Date Taking? Authorizing Provider  amLODipine (NORVASC) 5 MG tablet Take by mouth. 08/24/17 08/24/18  [provider]  atorvastatin (LIPITOR) 40 MG tablet Take 40 mg by mouth daily.    [provider]  losartan-hydrochlorothiazide (HYZAAR) 100-25 MG tablet TAKE 1 TABLET BY MOUTH ONCE DAILY. 10/22/17   [provider]  meclizine (ANTIVERT) 25 MG tablet Take 1 tablet (25 mg total) by mouth 3 (three) times daily as needed for dizziness. 01/29/18   Phineas Semen, MD  ondansetron (ZOFRAN) 4 MG tablet Take 1 tablet (4 mg total) by mouth every 8 (eight) hours as needed for nausea or vomiting. 01/29/18   Phineas Semen, MD  predniSONE (DELTASONE) 10 MG tablet Take 60mg  orally day one, then 55 mg orally day two, then 50 mg orally day three, then taper by 5 mg daily over 12 days until complete. 01/04/18   Renford Dills, NP  vitamin B-12  (CYANOCOBALAMIN) 1000 MCG tablet Take by mouth.    [provider]  vitamin E 200 UNIT capsule Take by mouth.    [provider]    Allergies Lisinopril and Penicillins  Family History  Problem Relation Age of Onset  . Breast cancer Paternal Aunt 69    Social History Social History   Tobacco Use  . Smoking status: Current Every Day Smoker    Packs/day: 0.50    Types: Cigarettes    Start date: 07/07/1989  . Smokeless tobacco: Never Used  Substance Use Topics  . Alcohol use: Yes    Alcohol/week: 7.0 standard drinks    Types: 7 Standard drinks or equivalent per week  . Drug use: Never    Review of Systems  Constitutional: No fever/chills Eyes: No visual changes. ENT: No sore throat. Cardiovascular: Denies chest pain. Respiratory: Denies shortness of breath. Gastrointestinal: No abdominal pain.  No nausea, no vomiting.  No diarrhea.  No constipation. Genitourinary: Negative for dysuria. Musculoskeletal: Negative for back pain. Skin: Negative for rash. Neurological: Negative for headaches, focal weakness  ____________________________________________   PHYSICAL EXAM:  VITAL SIGNS: ED Triage Vitals  Enc Vitals Group     BP 08/10/18 0328 135/81     Pulse Rate 08/10/18 0328 85     Resp 08/10/18 0328 18     Temp 08/10/18 0328 98 F (36.7 C)     Temp Source 08/10/18 0328 Oral  SpO2 08/10/18 0328 98 %     Weight 08/10/18 0327 170 lb (77.1 kg)     Height 08/10/18 0327 5\' 3"  (1.6 m)     Head Circumference --      Peak Flow --      Pain Score 08/10/18 0327 7     Pain Loc --      Pain Edu? --      Excl. in GC? --     Constitutional: Alert and oriented. Well appearing and in no acute distress. Eyes: Conjunctivae are normal.  Head: Atraumatic. Nose: No congestion/rhinnorhea. Mouth/Throat: Mucous membranes are moist.  Oropharynx non-erythematous. Neck: No stridor. Respiratory: Normal respiratory effort.  No retractions. Lungs  CTAB.Marland Kitchen Musculoskeletal: No redness swelling or tenderness except for right over the head of the second M TP joint to palpation on the sole of the foot only Neurologic:  Normal speech and language. No gross focal neurologic deficits are appreciated.  Skin:  Skin is warm, dry and intact. No rash noted. Psychiatric: Mood and affect are normal. Speech and behavior are normal.  ____________________________________________   LABS (all labs ordered are listed, but only abnormal results are displayed)  Labs Reviewed - No data to display ____________________________________________  EKG   ____________________________________________  RADIOLOGY  ED MD interpretation: X-ray read by radiology reviewed by me shows no acute problems  Official radiology report(s): Dg Toe 2nd Right  Result Date: 08/10/2018 CLINICAL DATA:  58 year old female with pain in the right second toe. EXAM: RIGHT SECOND TOE COMPARISON:  Right foot radiograph dated 01/28/2016 FINDINGS: There is no evidence of fracture or dislocation. There is no evidence of arthropathy or other focal bone abnormality. Soft tissues are unremarkable. IMPRESSION: Negative. Electronically Signed   By: Elgie Collard M.D.   On: 08/10/2018 04:36    ____________________________________________   PROCEDURES  Procedure(s) performed (including Critical Care):  Procedures   ____________________________________________   INITIAL IMPRESSION / ASSESSMENT AND PLAN / ED COURSE  I explained to the patient that her symptoms may be due to a small malalignment of the metatarsals where the second metatarsal may be more prominent than the other bones and when she walks on hard flat surfaces and shoes with thin soles or no padding she may be putting a lot of pressure on that metatarsal and it may be hurting.  I do not see any other obvious problems there does not seem to be an Morton's neuroma there is certainly no sign of any fractures there is no  tenderness or swelling elsewhere in the entire foot except for the head of the second metatarsal.  I have advised her to get some gel insoles and if that does not work to follow-up with podiatry Dr. Graciela Husbands is on-call.      ____________________________________________   FINAL CLINICAL IMPRESSION(S) / ED DIAGNOSES  Final diagnoses:  Pain of toe of right foot     ED Discharge Orders    None       Note:  This document was prepared using Dragon voice recognition software and may include unintentional dictation errors.    Arnaldo Natal, MD 08/10/18 [phone]

## 2018-08-10 NOTE — ED Triage Notes (Addendum)
Patient ambulatory to triage with steady gait, without difficulty or distress noted; pt reports right 2nd toe pain x 3wks; denies any injury, swelling or infection

## 2018-08-10 NOTE — Discharge Instructions (Addendum)
Try some gel insoles in your shoes.  Should be oh to get them at a shoe store or possibly at [place].  If that does not help with your toes after a few days I would give Dr.Cline, the podiatrist to call.  He should be out of see you fairly rapidly after you call him.  Please return here for severe pain redness or swelling.

## 2019-01-27 ENCOUNTER — Encounter: Payer: Self-pay | Admitting: Emergency Medicine

## 2019-01-27 ENCOUNTER — Emergency Department: Payer: BC Managed Care – PPO

## 2019-01-27 ENCOUNTER — Other Ambulatory Visit: Payer: Self-pay

## 2019-01-27 ENCOUNTER — Emergency Department
Admission: EM | Admit: 2019-01-27 | Discharge: 2019-01-27 | Disposition: A | Discharge status: Home / Self Care | Payer: BC Managed Care – PPO | Attending: Emergency Medicine | Admitting: Emergency Medicine

## 2019-01-27 DIAGNOSIS — R531 Weakness: Secondary | ICD-10-CM | POA: Diagnosis not present

## 2019-01-27 DIAGNOSIS — F17210 Nicotine dependence, cigarettes, uncomplicated: Secondary | ICD-10-CM | POA: Diagnosis not present

## 2019-01-27 DIAGNOSIS — I10 Essential (primary) hypertension: Secondary | ICD-10-CM | POA: Insufficient documentation

## 2019-01-27 DIAGNOSIS — R55 Syncope and collapse: Secondary | ICD-10-CM | POA: Insufficient documentation

## 2019-01-27 DIAGNOSIS — R5383 Other fatigue: Secondary | ICD-10-CM | POA: Insufficient documentation

## 2019-01-27 DIAGNOSIS — Z79899 Other long term (current) drug therapy: Secondary | ICD-10-CM | POA: Diagnosis not present

## 2019-01-27 LAB — URINALYSIS, COMPLETE (UACMP) WITH MICROSCOPIC
Bacteria, UA: NONE SEEN
Bilirubin Urine: NEGATIVE
Glucose, UA: NEGATIVE mg/dL
Hgb urine dipstick: NEGATIVE
Ketones, ur: NEGATIVE mg/dL
Leukocytes,Ua: NEGATIVE
Nitrite: NEGATIVE
Protein, ur: NEGATIVE mg/dL
Specific Gravity, Urine: 1.002 — ABNORMAL LOW (ref 1.005–1.030)
Squamous Epithelial / LPF: NONE SEEN (ref 0–5)
WBC, UA: NONE SEEN WBC/hpf (ref 0–5)
pH: 8 (ref 5.0–8.0)

## 2019-01-27 LAB — CBC
HCT: 38.1 % (ref 36.0–46.0)
Hemoglobin: 13.1 g/dL (ref 12.0–15.0)
MCH: 31.2 pg (ref 26.0–34.0)
MCHC: 34.4 g/dL (ref 30.0–36.0)
MCV: 90.7 fL (ref 80.0–100.0)
Platelets: 204 10*3/uL (ref 150–400)
RBC: 4.2 MIL/uL (ref 3.87–5.11)
RDW: 12.7 % (ref 11.5–15.5)
WBC: 5.7 10*3/uL (ref 4.0–10.5)
nRBC: 0 % (ref 0.0–0.2)

## 2019-01-27 LAB — BASIC METABOLIC PANEL
Anion gap: 11 (ref 5–15)
BUN: 13 mg/dL (ref 6–20)
CO2: 25 mmol/L (ref 22–32)
Calcium: 9.9 mg/dL (ref 8.9–10.3)
Chloride: 102 mmol/L (ref 98–111)
Creatinine, Ser: 0.86 mg/dL (ref 0.44–1.00)
GFR calc Af Amer: >60 mL/min (ref >60)
GFR calc non Af Amer: >60 mL/min (ref >60)
Glucose, Bld: 97 mg/dL (ref 70–99)
Potassium: 3.1 mmol/L — ABNORMAL LOW (ref 3.5–5.1)
Sodium: 138 mmol/L (ref 135–145)

## 2019-01-27 LAB — TROPONIN I (HIGH SENSITIVITY): Troponin I (High Sensitivity): 5 ng/L (ref <18)

## 2019-01-27 MED ORDER — SODIUM CHLORIDE 0.9 % IV BOLUS
1000.0000 mL | Freq: Once | INTRAVENOUS | Status: AC
  Administered 2019-01-27: 1000 mL via INTRAVENOUS

## 2019-01-27 MED ORDER — SODIUM CHLORIDE 0.9% FLUSH: 3.0000 mL | Freq: Once | INTRAVENOUS | Status: DC

## 2019-01-27 NOTE — ED Triage Notes (Signed)
Says she passed out on Sunday and since then she sometimes feels like passing out.  Says she thinks she hurt her ribs when she passed out Sunday.

## 2019-01-27 NOTE — Discharge Instructions (Signed)
As we discussed please discontinue your blood pressure medications (hydrochlorothiazide/losartan as well as amlodipine).  Please begin checking her blood pressure twice daily, if your blood pressure is persistently 140 or higher please restart your hydrochlorothiazide/losartan.  Please follow-up with your doctor Monday or Tuesday for recheck/reevaluation.  Return to the emergency department for any further episodes of weakness lightheadedness, any chest pain, trouble breathing, or any other symptom personally concerning to yourself.

## 2019-01-27 NOTE — ED Notes (Addendum)
Pt is being discharged to home. Pt is Aox4, VSS, pt does not show any signs of distress. AVS was given and explained to the pt and she verbalized understanding of all information.

## 2019-01-27 NOTE — ED Provider Notes (Signed)
[facility] Emergency Department Provider Note  Time seen: 12:28 PM  I have reviewed the triage vital signs and the nursing notes.   HISTORY  Chief Complaint Loss of Consciousness   HPI Teresa Vaughn is a 58 y.o. female with a past medical history of hypertension, hyperlipidemia presents to the emergency department with generalized fatigue/weakness.  According to the patient on Sunday she had a syncopal event while cooking in the kitchen.  States she thinks she fully passed out and fell to the ground, has been complaining of right rib pain ever since the fall.  Patient states since that time and really for about a week prior to that she has been feeling very weak.  States several weeks ago she saw eye doctor and was told her blood pressure is low but was not taken off of her blood pressure medications.  Patient's blood pressure remains low currently 89 systolic during my evaluation.  Patient takes losartan/hydrochlorothiazide as well as amlodipine daily.  Patient states she has continued to take these medications, does not have a blood pressure cuff to check her blood pressure at home.  Denies any fever cough or shortness of breath.    Past Medical History:  Diagnosis Date  . Hypercholesteremia   . Hypertension     There are no active problems to display for this patient.   Past Surgical History:  Procedure Laterality Date  . ABDOMINAL HYSTERECTOMY    . TONSILLECTOMY      Prior to Admission medications   Medication Sig Start Date End Date Taking? Authorizing Provider  amLODipine (NORVASC) 5 MG tablet Take 5 mg by mouth daily.  08/24/17 01/27/19 Yes [provider]  atorvastatin (LIPITOR) 40 MG tablet Take 40 mg by mouth daily.   Yes [provider]  fenofibrate (TRICOR) 48 MG tablet Take 1 tablet by mouth daily. 12/03/18  Yes [provider]  losartan-hydrochlorothiazide (HYZAAR) 100-25 MG tablet TAKE 1 TABLET BY MOUTH ONCE DAILY.  10/22/17  Yes [provider]  meloxicam (MOBIC) 15 MG tablet Take 1 tablet by mouth daily as needed for pain. 01/18/19  Yes [provider]  predniSONE (DELTASONE) 10 MG tablet Take 60mg  orally day one, then 55 mg orally day two, then 50 mg orally day three, then taper by 5 mg daily over 12 days until complete. 01/04/18  Yes Marylene Land, NP  vitamin B-12 (CYANOCOBALAMIN) 1000 MCG tablet Take 1,000 mcg by mouth daily.    Yes [provider]  vitamin E 200 UNIT capsule Take 200 Units by mouth daily.    Yes [provider]  meclizine (ANTIVERT) 25 MG tablet Take 1 tablet (25 mg total) by mouth 3 (three) times daily as needed for dizziness. Patient not taking: Reported on 01/27/2019 01/29/18   Nance Pear, MD  ondansetron (ZOFRAN) 4 MG tablet Take 1 tablet (4 mg total) by mouth every 8 (eight) hours as needed for nausea or vomiting. Patient not taking: Reported on 01/27/2019 01/29/18   Nance Pear, MD    Allergies  Allergen Reactions  . Lisinopril Cough  . Penicillins Hives    Family History  Problem Relation Age of Onset  . Breast cancer Paternal Aunt 68    Social History Social History   Tobacco Use  . Smoking status: Current Every Day Smoker    Packs/day: 0.50    Types: Cigarettes    Start date: 07/07/1989  . Smokeless tobacco: Never Used  Substance Use Topics  . Alcohol use:  Yes    Alcohol/week: 7.0 standard drinks    Types: 7 Standard drinks or equivalent per week  . Drug use: Never    Review of Systems Constitutional: Negative for fever.  Positive for generalized fatigue/weakness. ENT: Negative for recent illness/congestion Cardiovascular: Mild right lateral chest wall pain since her fall Sunday. Respiratory: Negative for shortness of breath. Gastrointestinal: Negative for abdominal pain Musculoskeletal: Negative for musculoskeletal complaints Skin: Negative for skin complaints  Neurological: Negative for headache All other ROS  negative  ____________________________________________   PHYSICAL EXAM:  VITAL SIGNS: ED Triage Vitals [01/27/19 1034]  Enc Vitals Group     BP      Pulse      Resp      Temp      Temp src      SpO2      Weight      Height      Head Circumference      Peak Flow      Pain Score 0     Pain Loc      Pain Edu?      Excl. in GC?    Constitutional: Alert and oriented. Well appearing and in no distress. Eyes: Normal exam ENT      Head: Normocephalic and atraumatic.      Mouth/Throat: Mucous membranes are moist. Cardiovascular: Normal rate, regular rhythm. No murmur Respiratory: Normal respiratory effort without tachypnea nor retractions. Breath sounds are clear  Gastrointestinal: Soft and nontender. No distention.  Musculoskeletal: Nontender with normal range of motion in all extremities.  Neurologic:  Normal speech and language. No gross focal neurologic deficits  Skin:  Skin is warm, dry and intact.  Psychiatric: Mood and affect are normal.  ____________________________________________    EKG  EKG viewed and interpreted by myself shows a normal sinus rhythm at 95 bpm with a narrow QRS, normal axis, normal intervals, nonspecific ST changes.  ____________________________________________    RADIOLOGY  Chest x-ray negative  ____________________________________________   INITIAL IMPRESSION / ASSESSMENT AND PLAN / ED COURSE  Pertinent labs & imaging results that were available during my care of the patient were reviewed by me and considered in my medical decision making (see chart for details).   Patient presents to the emergency department after syncopal episode on Sunday continues to feel weak.  Notably patient's blood pressure is 89 systolic.  Was told several weeks ago that her blood pressure was low in the office as well however she continues to take her to blood pressure medications.  Differential at this time would include ACS, arrhythmias, metabolic or  electrolyte abnormality, dehydration, hypotension.  We will check labs, dose IV fluids and continue to closely monitor.  Patient's blood pressure is improved currently 118 systolic.  Highly suspect this to be related to her blood pressure medication.  Patient will follow-up with her doctor Monday or Tuesday of this coming week.  I discussed with the patient to stop both her losartan/hydrochlorothiazide as well as her amlodipine.  Patient will start checking her blood pressure daily with a monitor.  I told her she may restart the losartan/hydrochlorothiazide if her blood pressure consistently is above 140.  Otherwise she will follow-up with her doctor.  I discussed very strict return precautions.  Patient agreeable to plan of care.  Teresa Vaughn was evaluated in Emergency Department on 01/27/2019 for the symptoms described in the history of present illness. She was evaluated in the context of the global COVID-19 pandemic, which necessitated consideration that  the patient might be at risk for infection with the SARS-CoV-2 virus that causes COVID-19. Institutional protocols and algorithms that pertain to the evaluation of patients at risk for COVID-19 are in a state of rapid change based on information released by regulatory bodies including the CDC and federal and state organizations. These policies and algorithms were followed during the patient's care in the ED.  ____________________________________________   FINAL CLINICAL IMPRESSION(S) / ED DIAGNOSES  Hypotension Weakness Syncope   Minna AntisPaduchowski, Haruto Demaria, MD 01/27/19 1440

## 2019-02-10 ENCOUNTER — Other Ambulatory Visit: Payer: Self-pay | Admitting: Sports Medicine

## 2019-02-10 DIAGNOSIS — G8929 Other chronic pain: Secondary | ICD-10-CM

## 2019-02-17 ENCOUNTER — Other Ambulatory Visit: Payer: Self-pay

## 2019-02-17 ENCOUNTER — Ambulatory Visit
Admission: RE | Admit: 2019-02-17 | Discharge: 2019-02-17 | Disposition: A | Discharge status: Home / Self Care | Payer: BC Managed Care – PPO | Source: Ambulatory Visit | Attending: Sports Medicine | Admitting: Sports Medicine

## 2019-02-17 DIAGNOSIS — M5441 Lumbago with sciatica, right side: Secondary | ICD-10-CM | POA: Insufficient documentation

## 2019-02-17 DIAGNOSIS — G8929 Other chronic pain: Secondary | ICD-10-CM | POA: Diagnosis present

## 2019-03-07 ENCOUNTER — Other Ambulatory Visit: Payer: Self-pay | Admitting: Internal Medicine

## 2019-03-07 DIAGNOSIS — Z1231 Encounter for screening mammogram for malignant neoplasm of breast: Secondary | ICD-10-CM

## 2019-03-30 ENCOUNTER — Other Ambulatory Visit: Payer: Self-pay

## 2019-03-30 ENCOUNTER — Emergency Department: Payer: BC Managed Care – PPO

## 2019-03-30 ENCOUNTER — Emergency Department
Admission: EM | Admit: 2019-03-30 | Discharge: 2019-03-31 | Disposition: A | Discharge status: Home / Self Care | Payer: BC Managed Care – PPO | Attending: Emergency Medicine | Admitting: Emergency Medicine

## 2019-03-30 DIAGNOSIS — R109 Unspecified abdominal pain: Secondary | ICD-10-CM

## 2019-03-30 DIAGNOSIS — I10 Essential (primary) hypertension: Secondary | ICD-10-CM | POA: Diagnosis not present

## 2019-03-30 DIAGNOSIS — Z79899 Other long term (current) drug therapy: Secondary | ICD-10-CM | POA: Insufficient documentation

## 2019-03-30 DIAGNOSIS — R1031 Right lower quadrant pain: Secondary | ICD-10-CM | POA: Diagnosis present

## 2019-03-30 DIAGNOSIS — F17210 Nicotine dependence, cigarettes, uncomplicated: Secondary | ICD-10-CM | POA: Diagnosis not present

## 2019-03-30 LAB — COMPREHENSIVE METABOLIC PANEL
ALT: 42 U/L (ref 0–44)
AST: 51 U/L — ABNORMAL HIGH (ref 15–41)
Albumin: 4.5 g/dL (ref 3.5–5.0)
Alkaline Phosphatase: 72 U/L (ref 38–126)
Anion gap: 9 (ref 5–15)
BUN: 16 mg/dL (ref 6–20)
CO2: 22 mmol/L (ref 22–32)
Calcium: 10 mg/dL (ref 8.9–10.3)
Chloride: 106 mmol/L (ref 98–111)
Creatinine, Ser: 0.73 mg/dL (ref 0.44–1.00)
GFR calc Af Amer: >60 mL/min (ref >60)
GFR calc non Af Amer: >60 mL/min (ref >60)
Glucose, Bld: 90 mg/dL (ref 70–99)
Potassium: 3.7 mmol/L (ref 3.5–5.1)
Sodium: 137 mmol/L (ref 135–145)
Total Bilirubin: 0.8 mg/dL (ref 0.3–1.2)
Total Protein: 8.7 g/dL — ABNORMAL HIGH (ref 6.5–8.1)

## 2019-03-30 LAB — URINALYSIS, COMPLETE (UACMP) WITH MICROSCOPIC
Bacteria, UA: NONE SEEN
Bilirubin Urine: NEGATIVE
Glucose, UA: NEGATIVE mg/dL
Hgb urine dipstick: NEGATIVE
Ketones, ur: NEGATIVE mg/dL
Leukocytes,Ua: NEGATIVE
Nitrite: NEGATIVE
Protein, ur: NEGATIVE mg/dL
Specific Gravity, Urine: 1.008 (ref 1.005–1.030)
pH: 5 (ref 5.0–8.0)

## 2019-03-30 LAB — CBC
HCT: 41.1 % (ref 36.0–46.0)
Hemoglobin: 13.9 g/dL (ref 12.0–15.0)
MCH: 30.5 pg (ref 26.0–34.0)
MCHC: 33.8 g/dL (ref 30.0–36.0)
MCV: 90.1 fL (ref 80.0–100.0)
Platelets: 209 10*3/uL (ref 150–400)
RBC: 4.56 MIL/uL (ref 3.87–5.11)
RDW: 12.3 % (ref 11.5–15.5)
WBC: 5.6 10*3/uL (ref 4.0–10.5)
nRBC: 0 % (ref 0.0–0.2)

## 2019-03-30 LAB — LIPASE, BLOOD: Lipase: 58 U/L — ABNORMAL HIGH (ref 11–51)

## 2019-03-30 MED ORDER — SODIUM CHLORIDE 0.9% FLUSH
3.0000 mL | Freq: Once | INTRAVENOUS | Status: AC
  Administered 2019-03-30: 3 mL via INTRAVENOUS

## 2019-03-30 MED ORDER — IOHEXOL 9 MG/ML PO SOLN
500.0000 mL | ORAL | Status: AC
  Administered 2019-03-30 (×2): 500 mL via ORAL

## 2019-03-30 MED ORDER — KETOROLAC TROMETHAMINE 30 MG/ML IJ SOLN
15.0000 mg | Freq: Once | INTRAMUSCULAR | Status: AC
  Administered 2019-03-30: 15 mg via INTRAVENOUS
  Filled 2019-03-30: qty 1

## 2019-03-30 MED ORDER — IOHEXOL 300 MG/ML  SOLN
100.0000 mL | Freq: Once | INTRAMUSCULAR | Status: AC | PRN
  Administered 2019-03-30: 100 mL via INTRAVENOUS

## 2019-03-30 MED ORDER — HYDROCODONE-ACETAMINOPHEN 5-325 MG PO TABS: 1.0000 | ORAL_TABLET | Freq: Four times a day (QID) | ORAL | 0 refills | Status: DC | PRN

## 2019-03-30 NOTE — Discharge Instructions (Signed)
Please return for worse pain, fever or vomiting.  Tomorrow please drink clear liquids like chicken soup.  If you need to you can use the Vicodin pill 1 pill 4 times a day for pain.  Be careful can make you constipated and woozy.  Do not fall.  Do not drive on it either.  Please follow-up with your doctor in the next few days as you planned to.

## 2019-03-30 NOTE — ED Triage Notes (Signed)
Pt drove self to ED today. Ambulatory to Stat desk w/o distress.

## 2019-03-30 NOTE — ED Provider Notes (Addendum)
[facility] Emergency Department Provider Note   ____________________________________________   First MD Initiated Contact with Patient 03/30/19 2105     (approximate)  I have reviewed the triage vital signs and the nursing notes.   HISTORY  Chief Complaint Abdominal Pain    HPI Teresa Vaughn is a 58 y.o. female patient complains of right lower quadrant pain for the last 3 to 5 days.  No urinary symptoms.  No nausea no vomiting and no diarrhea.  Pain is worse with palpation and movement.  There is no pain elsewhere.  She has not had this before as I understand.  Patient reports it is crampy in nature         Past Medical History:  Diagnosis Date  . Hypercholesteremia   . Hypertension     There are no active problems to display for this patient.   Past Surgical History:  Procedure Laterality Date  . ABDOMINAL HYSTERECTOMY    . TONSILLECTOMY      Prior to Admission medications   Medication Sig Start Date End Date Taking? Authorizing Provider  amLODipine (NORVASC) 5 MG tablet Take 5 mg by mouth daily.  08/24/17 01/27/19  [provider]  atorvastatin (LIPITOR) 40 MG tablet Take 40 mg by mouth daily.    [provider]  fenofibrate (TRICOR) 48 MG tablet Take 1 tablet by mouth daily. 12/03/18   [provider]  HYDROcodone-acetaminophen (NORCO/VICODIN) 5-325 MG tablet Take 1 tablet by mouth every 6 (six) hours as needed for moderate pain. 03/30/19   Arnaldo Natal, MD  losartan-hydrochlorothiazide (HYZAAR) 100-25 MG tablet TAKE 1 TABLET BY MOUTH ONCE DAILY. 10/22/17   [provider]  meclizine (ANTIVERT) 25 MG tablet Take 1 tablet (25 mg total) by mouth 3 (three) times daily as needed for dizziness. Patient not taking: Reported on 01/27/2019 01/29/18   Phineas Semen, MD  meloxicam (MOBIC) 15 MG tablet Take 1 tablet by mouth daily as needed for pain. 01/18/19   [provider]  ondansetron (ZOFRAN) 4  MG tablet Take 1 tablet (4 mg total) by mouth every 8 (eight) hours as needed for nausea or vomiting. Patient not taking: Reported on 01/27/2019 01/29/18   Phineas Semen, MD  predniSONE (DELTASONE) 10 MG tablet Take 60mg  orally day one, then 55 mg orally day two, then 50 mg orally day three, then taper by 5 mg daily over 12 days until complete. 01/04/18   01/06/18, NP  vitamin B-12 (CYANOCOBALAMIN) 1000 MCG tablet Take 1,000 mcg by mouth daily.     [provider]  vitamin E 200 UNIT capsule Take 200 Units by mouth daily.     [provider]    Allergies Lisinopril and Penicillins  Family History  Problem Relation Age of Onset  . Breast cancer Paternal Aunt 66    Social History Social History   Tobacco Use  . Smoking status: Current Every Day Smoker    Packs/day: 0.50    Types: Cigarettes    Start date: 07/07/1989  . Smokeless tobacco: Never Used  Substance Use Topics  . Alcohol use: Yes    Alcohol/week: 7.0 standard drinks    Types: 7 Standard drinks or equivalent per week  . Drug use: Never    Review of Systems  Constitutional: No fever/chills Eyes: No visual changes. ENT: No sore throat. Cardiovascular: Denies chest pain. Respiratory: Denies shortness of breath. Gastrointestinal: Right lower quadrant abdominal pain.  No nausea, no vomiting.  No diarrhea.  No constipation. Genitourinary: Negative for dysuria. Musculoskeletal: Negative for back pain. Skin: Negative for rash. Neurological: Negative for headaches, focal weakness  ____________________________________________   PHYSICAL EXAM:  VITAL SIGNS: ED Triage Vitals  Enc Vitals Group     BP 03/30/19 1943 (!) 189/98     Pulse Rate 03/30/19 1943 85     Resp 03/30/19 1943 18     Temp 03/30/19 1943 98.6 F (37 C)     Temp Source 03/30/19 1943 Oral     SpO2 03/30/19 1943 99 %     Weight 03/30/19 1944 160 lb (72.6 kg)     Height 03/30/19 1944 5\' 3"  (1.6 m)     Head Circumference --       Peak Flow --      Pain Score 03/30/19 1943 8     Pain Loc --      Pain Edu? --      Excl. in GC? --     Constitutional: Alert and oriented. Well appearing and in no acute distress. Eyes: Conjunctivae are normal.  Head: Atraumatic. Nose: No congestion/rhinnorhea. Mouth/Throat: Mucous membranes are moist.  Oropharynx non-erythematous. Neck: No stridor.   Cardiovascular: Normal rate, regular rhythm. Grossly normal heart sounds.  Good peripheral circulation. Respiratory: Normal respiratory effort.  No retractions. Lungs CTAB. Gastrointestinal: Soft tender to palpation on the right side of the abdomen especially the right lower quadrant.  There is no tenderness suprapubically.  Patient reports she feels me percussing her on the right side not really on the left.. No distention. No abdominal bruits. No CVA tenderness.  I do not palpate any masses. Musculoskeletal: No lower extremity tenderness nor edema.   Neurologic:  Normal speech and language. No gross focal neurologic deficits are appreciated.  Skin:  Skin is warm, dry and intact. No rash noted. . Rectal exam: Hemorrhoids are present.  There is no tenderness in the abdomen past the hemorrhoids.  Especially no tenderness of the uterine area.  ____________________________________________   LABS (all labs ordered are listed, but only abnormal results are displayed)  Labs Reviewed  LIPASE, BLOOD - Abnormal; Notable for the following components:      Result Value   Lipase 58 (*)    All other components within normal limits  COMPREHENSIVE METABOLIC PANEL - Abnormal; Notable for the following components:   Total Protein 8.7 (*)    AST 51 (*)    All other components within normal limits  URINALYSIS, COMPLETE (UACMP) WITH MICROSCOPIC - Abnormal; Notable for the following components:   Color, Urine STRAW (*)    APPearance CLEAR (*)    All other components within normal limits  CBC   ____________________________________________  EKG    ____________________________________________  RADIOLOGY  ED MD interpretation: CT read by radiology reviewed by me is negative.  The patient does have hemorrhoids.  Official radiology report(s): Ct Abdomen Pelvis W Contrast  Result Date: 03/30/2019 CLINICAL DATA:  58 year old female with right lower quadrant abdominal pain. EXAM: CT ABDOMEN AND PELVIS WITH CONTRAST TECHNIQUE: Multidetector CT imaging of the abdomen and pelvis was performed using the standard protocol following bolus administration of intravenous contrast. CONTRAST:  100mL OMNIPAQUE IOHEXOL 300 MG/ML  SOLN COMPARISON:  Abdominal ultrasound dated 11/11/2017. FINDINGS: Lower chest: The visualized lung bases are clear. No intra-abdominal free air or free fluid. Hepatobiliary: Apparent fatty infiltration of the liver. No intrahepatic biliary ductal dilatation. There multiple gallstones. No pericholecystic fluid or evidence of acute cholecystitis by CT. Pancreas: Unremarkable. No pancreatic ductal dilatation  or surrounding inflammatory changes. Spleen: Normal in size without focal abnormality. Adrenals/Urinary Tract: The adrenal glands are unremarkable. 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 The left kidney is unremarkable. There is no hydronephrosis on either side. There is symmetric enhancement and excretion of contrast by both kidneys. Subcentimeter left renal hypodense lesions are too small to characterize. The visualized ureters and urinary bladder appear unremarkable. Stomach/Bowel: Nodular soft tissue thickening at the anal orifice is not well evaluated but may represent hemorrhoids. Clinical correlation is recommended. There is no bowel obstruction or active inflammation. The appendix is normal. Vascular/Lymphatic: The abdominal aorta and IVC are unremarkable. No portal venous gas. There is no adenopathy. Reproductive: Hysterectomy. No adnexal masses. Other: None Musculoskeletal: Lower lumbar facet  arthropathy. No acute osseous pathology. IMPRESSION: 1. No acute intra-abdominal or pelvic pathology. No bowel obstruction or active inflammation. Normal appendix. 2. Cholelithiasis. 3. Fatty liver. 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 5. Nodular soft tissue thickening at the anal orifice is not well evaluated but may represent hemorrhoids. Clinical correlation is recommended. Electronically Signed   By: Anner Crete M.D.   On: 03/30/2019 23:32    ____________________________________________   PROCEDURES  Procedure(s) performed (including Critical Care):  Procedures   ____________________________________________   INITIAL IMPRESSION / ASSESSMENT AND PLAN / ED COURSE  Reexam after the patient CT shows that the right lower quadrant pain has resolved.  The pain is now in the mid abdomen around the umbilicus.  There is no right upper quadrant tenderness no epigastric tenderness no right lower quadrant tenderness and no suprapubic tenderness.  Patient has gotten some Toradol.  If this works for her pain and she is doing well I will let her go.  Otherwise we will reevaluate her and probably get her in the hospital.  Her lipase is slightly elevated.  Nothing showed up on the CT there though.  I will have her follow-up with her doctor or return if she gets worse if we do let her go.  I am uncertain of the etiology of her abdominal pain.  Do not think the lipase is high enough to make her that tender.  Additionally she was not tender there before.          ____________________________________________   FINAL CLINICAL IMPRESSION(S) / ED DIAGNOSES  Final diagnoses:  Right lower quadrant abdominal pain  Abdominal pain, unspecified abdominal location     ED Discharge Orders         Ordered    HYDROcodone-acetaminophen (NORCO/VICODIN) 5-325 MG tablet  Every 6 hours PRN     03/30/19 2348           Note:  This document was prepared using Dragon voice  recognition software and may include unintentional dictation errors.    Nena Polio, MD 03/30/19 [id number]    Nena Polio, MD 03/30/19 2351

## 2019-03-30 NOTE — ED Triage Notes (Signed)
Pt has right lower abd pian for 4-5 days.  Pt also has lower back pain.  Denies urinary sx.  No n/v/d. Pt alert.

## 2019-04-20 ENCOUNTER — Ambulatory Visit: Payer: Self-pay | Admitting: Surgery

## 2019-04-20 NOTE — H&P (View-Only) (Signed)
Subjective:   CC: Biliary colic [I34.74]  HPI:  Teresa Vaughn is a 58 y.o. female who was referred by Lisa Roca* for evaluation of above CC. Symptoms were first noted a few months ago. Pain is intermittent and severe, radiating to back, sometimes down into her RLQ, usually located in right upper quadrant, without radiation.  Associated with loose stools, exacerbated by nothing specific.  Occurs at night.  Incidentally noted anal nodule on CT.  Asymptomatic.  Never had colonoscopy.  Past Medical History:  has a past medical history of Hypertension.  Past Surgical History:  has a past surgical history that includes Hysterectomy and Tonsillectomy.  Family History: family history includes High blood pressure (Hypertension) in her father and mother.  Social History:  reports that she has been smoking. She has never used smokeless tobacco. She reports current alcohol use. She reports that she does not use drugs.  Current Medications: has a current medication list which includes the following prescription(s): amlodipine, cyanocobalamin, fenofibrate nanocrystallized, and meloxicam.  Allergies:  Allergies as of 04/19/2019 - Reviewed 04/19/2019  Allergen Reaction Noted  . Lisinopril Cough 02/01/2016  . Penicillin Hives 02/01/2016    ROS:  A 15 point review of systems was performed and pertinent positives and negatives noted in HPI    Objective:     BP (!) 172/99   Pulse 108   Ht 160 cm (5\' 3" )   Wt 73.5 kg (162 lb)   LMP  (LMP Unknown)   BMI 28.70 kg/m    Constitutional :  alert, appears stated age, cooperative and no distress  Lymphatics/Throat:  no asymmetry, masses, or scars  Respiratory:  clear to auscultation bilaterally  Cardiovascular:  regular rate and rhythm  Gastrointestinal: soft, non-tender; bowel sounds normal; no masses,  no organomegaly.    Musculoskeletal: Steady gait and movement  Skin: Cool and moist  Psychiatric: Normal affect, non-agitated,  not confused  Rectal: Chaperone present for exam.  External exam revealed possibly a internal hemorrhoid  DRE revealed, normal rectal tone, with palpable internal hemorrhoid noted.  Limited exam due to patient discomfort.        LABS:  n/a   RADS: CLINICAL DATA:  58 year old female with right lower quadrant abdominal pain.  EXAM: CT ABDOMEN AND PELVIS WITH CONTRAST  TECHNIQUE: Multidetector CT imaging of the abdomen and pelvis was performed using the standard protocol following bolus administration of intravenous contrast.  CONTRAST:  111mL OMNIPAQUE IOHEXOL 300 MG/ML  SOLN  COMPARISON:  Abdominal ultrasound dated 11/11/2017.  FINDINGS: Lower chest: The visualized lung bases are clear.  No intra-abdominal free air or free fluid.  Hepatobiliary: Apparent fatty infiltration of the liver. No intrahepatic biliary ductal dilatation. There multiple gallstones. No pericholecystic fluid or evidence of acute cholecystitis by CT.  Pancreas: Unremarkable. No pancreatic ductal dilatation or surrounding inflammatory changes.  Spleen: Normal in size without focal abnormality.  Adrenals/Urinary Tract: The adrenal glands are unremarkable. 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 The left kidney is unremarkable. There is no hydronephrosis on either side. There is symmetric enhancement and excretion of contrast by both kidneys. Subcentimeter left renal hypodense lesions are too small to characterize. The visualized ureters and urinary bladder appear unremarkable.  Stomach/Bowel: Nodular soft tissue thickening at the anal orifice is not well evaluated but may represent hemorrhoids. Clinical correlation is recommended. There is no bowel obstruction or active inflammation. The appendix is normal.  Vascular/Lymphatic: The abdominal aorta and IVC are unremarkable. No portal venous  gas. There is no adenopathy.  Reproductive: Hysterectomy. No  adnexal masses.  Other: None  Musculoskeletal: Lower lumbar facet arthropathy. No acute osseous pathology.  IMPRESSION: 1. No acute intra-abdominal or pelvic pathology. No bowel obstruction or active inflammation. Normal appendix. 2. Cholelithiasis. 3. Fatty liver. 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 5. Nodular soft tissue thickening at the anal orifice is not well evaluated but may represent hemorrhoids. Clinical correlation is recommended.   Electronically Signed   By: Elgie Collard M.D.   On: 03/30/2019 23:32   Assessment:      Biliary colic [K80.50]  Plan:     1. Biliary colic [K80.50] Discussed the risk of surgery including post-op infxn, seroma, biloma, chronic pain, poor-delayed wound healing, retained gallstone, conversion to open procedure, post-op SBO or ileus, and need for additional procedures to address said risks.  The risks of general anesthetic including MI, CVA, sudden death or even reaction to anesthetic medications also discussed. Alternatives include continued observation.  Benefits include possible symptom relief, prevention of complications including acute cholecystitis, pancreatitis.  Typical post operative recovery of 3-5 days rest, continued pain in area and incision sites, possible loose stools up to 4-6 weeks, also discussed.  ED return precautions given for sudden increase in RUQ pain, with possible accompanying fever, nausea, and/or vomiting.  The patient understands the risks, any and all questions were answered to the patient's satisfaction.  Pt is agreeable to proceed.  She requested I fill out FMLA paperwork to keep her out of work until then.  I explained to her that since she has atypical pain presentation and no objective findings of biliary issues, I will not fill out her paperwork at this time, and patient's do not need FMLA paperwork filled out to recover from the surgery either, due to the limited days  need as noted above.  She verbalized understanding.

## 2019-04-20 NOTE — H&P (Signed)
Subjective:   CC: Biliary colic [I34.74]  HPI:  Teresa Vaughn is a 58 y.o. female who was referred by Lisa Roca* for evaluation of above CC. Symptoms were first noted a few months ago. Pain is intermittent and severe, radiating to back, sometimes down into her RLQ, usually located in right upper quadrant, without radiation.  Associated with loose stools, exacerbated by nothing specific.  Occurs at night.  Incidentally noted anal nodule on CT.  Asymptomatic.  Never had colonoscopy.  Past Medical History:  has a past medical history of Hypertension.  Past Surgical History:  has a past surgical history that includes Hysterectomy and Tonsillectomy.  Family History: family history includes High blood pressure (Hypertension) in her father and mother.  Social History:  reports that she has been smoking. She has never used smokeless tobacco. She reports current alcohol use. She reports that she does not use drugs.  Current Medications: has a current medication list which includes the following prescription(s): amlodipine, cyanocobalamin, fenofibrate nanocrystallized, and meloxicam.  Allergies:  Allergies as of 04/19/2019 - Reviewed 04/19/2019  Allergen Reaction Noted  . Lisinopril Cough 02/01/2016  . Penicillin Hives 02/01/2016    ROS:  A 15 point review of systems was performed and pertinent positives and negatives noted in HPI    Objective:     BP (!) 172/99   Pulse 108   Ht 160 cm (5\' 3" )   Wt 73.5 kg (162 lb)   LMP  (LMP Unknown)   BMI 28.70 kg/m    Constitutional :  alert, appears stated age, cooperative and no distress  Lymphatics/Throat:  no asymmetry, masses, or scars  Respiratory:  clear to auscultation bilaterally  Cardiovascular:  regular rate and rhythm  Gastrointestinal: soft, non-tender; bowel sounds normal; no masses,  no organomegaly.    Musculoskeletal: Steady gait and movement  Skin: Cool and moist  Psychiatric: Normal affect, non-agitated,  not confused  Rectal: Chaperone present for exam.  External exam revealed possibly a internal hemorrhoid  DRE revealed, normal rectal tone, with palpable internal hemorrhoid noted.  Limited exam due to patient discomfort.        LABS:  n/a   RADS: CLINICAL DATA:  58 year old female with right lower quadrant abdominal pain.  EXAM: CT ABDOMEN AND PELVIS WITH CONTRAST  TECHNIQUE: Multidetector CT imaging of the abdomen and pelvis was performed using the standard protocol following bolus administration of intravenous contrast.  CONTRAST:  111mL OMNIPAQUE IOHEXOL 300 MG/ML  SOLN  COMPARISON:  Abdominal ultrasound dated 11/11/2017.  FINDINGS: Lower chest: The visualized lung bases are clear.  No intra-abdominal free air or free fluid.  Hepatobiliary: Apparent fatty infiltration of the liver. No intrahepatic biliary ductal dilatation. There multiple gallstones. No pericholecystic fluid or evidence of acute cholecystitis by CT.  Pancreas: Unremarkable. No pancreatic ductal dilatation or surrounding inflammatory changes.  Spleen: Normal in size without focal abnormality.  Adrenals/Urinary Tract: The adrenal glands are unremarkable. 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 The left kidney is unremarkable. There is no hydronephrosis on either side. There is symmetric enhancement and excretion of contrast by both kidneys. Subcentimeter left renal hypodense lesions are too small to characterize. The visualized ureters and urinary bladder appear unremarkable.  Stomach/Bowel: Nodular soft tissue thickening at the anal orifice is not well evaluated but may represent hemorrhoids. Clinical correlation is recommended. There is no bowel obstruction or active inflammation. The appendix is normal.  Vascular/Lymphatic: The abdominal aorta and IVC are unremarkable. No portal venous  gas. There is no adenopathy.  Reproductive: Hysterectomy. No  adnexal masses.  Other: None  Musculoskeletal: Lower lumbar facet arthropathy. No acute osseous pathology.  IMPRESSION: 1. No acute intra-abdominal or pelvic pathology. No bowel obstruction or active inflammation. Normal appendix. 2. Cholelithiasis. 3. Fatty liver. 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 5. Nodular soft tissue thickening at the anal orifice is not well evaluated but may represent hemorrhoids. Clinical correlation is recommended.   Electronically Signed   By: Arash  Radparvar M.D.   On: 03/30/2019 23:32   Assessment:      Biliary colic [K80.50]  Plan:     1. Biliary colic [K80.50] Discussed the risk of surgery including post-op infxn, seroma, biloma, chronic pain, poor-delayed wound healing, retained gallstone, conversion to open procedure, post-op SBO or ileus, and need for additional procedures to address said risks.  The risks of general anesthetic including MI, CVA, sudden death or even reaction to anesthetic medications also discussed. Alternatives include continued observation.  Benefits include possible symptom relief, prevention of complications including acute cholecystitis, pancreatitis.  Typical post operative recovery of 3-5 days rest, continued pain in area and incision sites, possible loose stools up to 4-6 weeks, also discussed.  ED return precautions given for sudden increase in RUQ pain, with possible accompanying fever, nausea, and/or vomiting.  The patient understands the risks, any and all questions were answered to the patient's satisfaction.  Pt is agreeable to proceed.  She requested I fill out FMLA paperwork to keep her out of work until then.  I explained to her that since she has atypical pain presentation and no objective findings of biliary issues, I will not fill out her paperwork at this time, and patient's do not need FMLA paperwork filled out to recover from the surgery either, due to the limited days  need as noted above.  She verbalized understanding.   

## 2019-04-22 ENCOUNTER — Other Ambulatory Visit
Admission: RE | Admit: 2019-04-22 | Discharge: 2019-04-22 | Disposition: A | Discharge status: Home / Self Care | Payer: BC Managed Care – PPO | Source: Ambulatory Visit | Attending: Surgery | Admitting: Surgery

## 2019-04-22 ENCOUNTER — Other Ambulatory Visit: Payer: Self-pay

## 2019-04-22 HISTORY — DX: Other specified postprocedural states: R11.2

## 2019-04-22 HISTORY — DX: Other specified postprocedural states: Z98.890

## 2019-04-22 NOTE — Patient Instructions (Signed)
Your procedure is scheduled on: Thursday 04/28/19.  Report to DAY SURGERY DEPARTMENT LOCATED ON 2ND FLOOR MEDICAL MALL ENTRANCE. To find out your arrival time please call [phone] between 1PM - 3PM on Wednesday 04/27/19.     Remember: Instructions that are not followed completely may result in serious medical risk, up to and including death, or upon the discretion of your surgeon and anesthesiologist your surgery may need to be rescheduled.      _X__ 1. Do not eat food after midnight the night before your procedure.                 No gum chewing or hard candies. You may drink clear liquids up to 2 hours                 before you are scheduled to arrive for your surgery- DO NOT drink clear                 liquids within 2 hours of the start of your surgery.                 Clear Liquids include:  water, apple juice without pulp, clear carbohydrate                 drink such as Clearfast or Gatorade, Black Coffee or Tea (Do not add                 anything to coffee or tea).    __X__2.  On the morning of surgery brush your teeth with toothpaste and water, you may rinse your mouth with mouthwash if you wish.  Do not swallow any toothpaste or mouthwash.      _X__ 3.  No Alcohol for 24 hours before or after surgery.    _X__ 4.  Do Not Smoke or use e-cigarettes For 24 Hours Prior to Your Surgery.                 Do not use any chewable tobacco products for at least 6 hours prior to                 surgery.   __X__6.  Notify your doctor if there is any change in your medical condition      (cold, fever, infections).      Do not wear jewelry, make-up, hairpins, clips or nail polish. Do not wear lotions, powders, or perfumes.  Do not shave 48 hours prior to surgery. Men may shave face and neck. Do not bring valuables to the hospital.      [facility] is not responsible for any belongings or valuables.    Contacts, dentures/partials or body piercings may not be worn into  surgery. Bring a case for your contacts, glasses or hearing aids, a denture cup will be supplied.    Patients discharged the day of surgery will not be allowed to drive home.   __X__ Take these medicines the morning of surgery with A SIP OF WATER:     1. amLODipine (NORVASC) 5 MG tablet  2. fenofibrate (TRICOR) 48 MG tablet  3. HYDROcodone-acetaminophen (NORCO/VICODIN) 5-325 MG tablet     __X__ Use CHG Soap as directed    __X__ Stop Anti-inflammatories 7 days before surgery such as Advil, Ibuprofen, Motrin, BC or Goodies Powder, Naprosyn, Naproxen, Aleve, Aspirin, Meloxicam. May take Tylenol or HYDROcodone-acetaminophen (NORCO/VICODIN) 5-325 MG tablet if needed for pain or discomfort.    __X__ Don't start taking any  new herbal supplements before your procedure.

## 2019-04-25 ENCOUNTER — Other Ambulatory Visit: Payer: Self-pay

## 2019-04-25 ENCOUNTER — Other Ambulatory Visit
Admission: RE | Admit: 2019-04-25 | Discharge: 2019-04-25 | Disposition: A | Discharge status: Home / Self Care | Payer: BC Managed Care – PPO | Source: Ambulatory Visit | Attending: Surgery | Admitting: Surgery

## 2019-04-25 DIAGNOSIS — Z01812 Encounter for preprocedural laboratory examination: Secondary | ICD-10-CM | POA: Insufficient documentation

## 2019-04-25 DIAGNOSIS — Z20828 Contact with and (suspected) exposure to other viral communicable diseases: Secondary | ICD-10-CM | POA: Diagnosis not present

## 2019-04-25 LAB — SARS CORONAVIRUS 2 (TAT 6-24 HRS): SARS Coronavirus 2: NEGATIVE

## 2019-04-26 ENCOUNTER — Ambulatory Visit
Admission: RE | Admit: 2019-04-26 | Discharge: 2019-04-26 | Disposition: A | Discharge status: Home / Self Care | Payer: BC Managed Care – PPO | Source: Ambulatory Visit | Attending: Internal Medicine | Admitting: Internal Medicine

## 2019-04-26 DIAGNOSIS — Z1231 Encounter for screening mammogram for malignant neoplasm of breast: Secondary | ICD-10-CM | POA: Diagnosis not present

## 2019-04-27 MED ORDER — CLINDAMYCIN PHOSPHATE 900 MG/50ML IV SOLN
900.0000 mg | INTRAVENOUS | Status: AC
  Administered 2019-04-28: 11:00:00 900 mg via INTRAVENOUS

## 2019-04-28 ENCOUNTER — Encounter
Admission: RE | Disposition: A | Discharge status: Home / Self Care | Payer: Self-pay | Source: Home / Self Care | Attending: Surgery

## 2019-04-28 ENCOUNTER — Ambulatory Visit: Payer: BC Managed Care – PPO | Admitting: Anesthesiology

## 2019-04-28 ENCOUNTER — Other Ambulatory Visit: Payer: Self-pay

## 2019-04-28 ENCOUNTER — Ambulatory Visit
Admission: RE | Admit: 2019-04-28 | Discharge: 2019-04-28 | Disposition: A | Discharge status: Home / Self Care | Payer: BC Managed Care – PPO | Attending: Surgery | Admitting: Surgery

## 2019-04-28 DIAGNOSIS — F17200 Nicotine dependence, unspecified, uncomplicated: Secondary | ICD-10-CM | POA: Insufficient documentation

## 2019-04-28 DIAGNOSIS — K8050 Calculus of bile duct without cholangitis or cholecystitis without obstruction: Secondary | ICD-10-CM | POA: Diagnosis present

## 2019-04-28 DIAGNOSIS — Z791 Long term (current) use of non-steroidal anti-inflammatories (NSAID): Secondary | ICD-10-CM | POA: Diagnosis not present

## 2019-04-28 DIAGNOSIS — I10 Essential (primary) hypertension: Secondary | ICD-10-CM | POA: Insufficient documentation

## 2019-04-28 DIAGNOSIS — K8010 Calculus of gallbladder with chronic cholecystitis without obstruction: Secondary | ICD-10-CM | POA: Diagnosis not present

## 2019-04-28 DIAGNOSIS — Z79899 Other long term (current) drug therapy: Secondary | ICD-10-CM | POA: Insufficient documentation

## 2019-04-28 DIAGNOSIS — K810 Acute cholecystitis: Secondary | ICD-10-CM

## 2019-04-28 DIAGNOSIS — E7800 Pure hypercholesterolemia, unspecified: Secondary | ICD-10-CM | POA: Diagnosis not present

## 2019-04-28 SURGERY — CHOLECYSTECTOMY, ROBOT-ASSISTED, LAPAROSCOPIC
Anesthesia: General | Site: Abdomen

## 2019-04-28 MED ORDER — EPHEDRINE SULFATE 50 MG/ML IJ SOLN
INTRAMUSCULAR | Status: AC
  Filled 2019-04-28: qty 1

## 2019-04-28 MED ORDER — SCOPOLAMINE 1 MG/3DAYS TD PT72
MEDICATED_PATCH | TRANSDERMAL | Status: AC
  Filled 2019-04-28: qty 1

## 2019-04-28 MED ORDER — SUCCINYLCHOLINE CHLORIDE 20 MG/ML IJ SOLN
INTRAMUSCULAR | Status: DC | PRN
  Administered 2019-04-28: 100 mg via INTRAVENOUS

## 2019-04-28 MED ORDER — GLYCOPYRROLATE 0.2 MG/ML IJ SOLN
INTRAMUSCULAR | Status: DC | PRN
  Administered 2019-04-28: 0.2 mg via INTRAVENOUS

## 2019-04-28 MED ORDER — ACETAMINOPHEN 500 MG PO TABS
1000.0000 mg | ORAL_TABLET | ORAL | Status: AC
  Administered 2019-04-28: 09:00:00 1000 mg via ORAL

## 2019-04-28 MED ORDER — ROCURONIUM BROMIDE 50 MG/5ML IV SOLN
INTRAVENOUS | Status: AC
  Filled 2019-04-28: qty 1

## 2019-04-28 MED ORDER — ONDANSETRON HCL 4 MG/2ML IJ SOLN
INTRAMUSCULAR | Status: AC
  Filled 2019-04-28: qty 2

## 2019-04-28 MED ORDER — HYDROCODONE-ACETAMINOPHEN 7.5-325 MG PO TABS
1.0000 | ORAL_TABLET | Freq: Once | ORAL | Status: DC | PRN
  Filled 2019-04-28: qty 1

## 2019-04-28 MED ORDER — MIDAZOLAM HCL 2 MG/2ML IJ SOLN
INTRAMUSCULAR | Status: DC | PRN
  Administered 2019-04-28: 2 mg via INTRAVENOUS

## 2019-04-28 MED ORDER — GABAPENTIN 300 MG PO CAPS
300.0000 mg | ORAL_CAPSULE | ORAL | Status: AC
  Administered 2019-04-28: 09:00:00 300 mg via ORAL

## 2019-04-28 MED ORDER — LIDOCAINE-EPINEPHRINE 1 %-1:100000 IJ SOLN
INTRAMUSCULAR | Status: AC
  Filled 2019-04-28: qty 1

## 2019-04-28 MED ORDER — CELECOXIB 200 MG PO CAPS
ORAL_CAPSULE | ORAL | Status: AC
  Administered 2019-04-28: 200 mg via ORAL
  Filled 2019-04-28: qty 1

## 2019-04-28 MED ORDER — FENTANYL CITRATE (PF) 100 MCG/2ML IJ SOLN: 25.0000 ug | INTRAMUSCULAR | Status: DC | PRN

## 2019-04-28 MED ORDER — GLYCOPYRROLATE 0.2 MG/ML IJ SOLN
INTRAMUSCULAR | Status: AC
  Filled 2019-04-28: qty 2

## 2019-04-28 MED ORDER — PROPOFOL 10 MG/ML IV BOLUS
INTRAVENOUS | Status: DC | PRN
  Administered 2019-04-28: 170 mg via INTRAVENOUS

## 2019-04-28 MED ORDER — HYDROMORPHONE HCL 1 MG/ML IJ SOLN
INTRAMUSCULAR | Status: AC
  Filled 2019-04-28: qty 1

## 2019-04-28 MED ORDER — CLINDAMYCIN PHOSPHATE 900 MG/50ML IV SOLN
INTRAVENOUS | Status: AC
  Filled 2019-04-28: qty 50

## 2019-04-28 MED ORDER — DOCUSATE SODIUM 100 MG PO CAPS: 100.0000 mg | ORAL_CAPSULE | Freq: Two times a day (BID) | ORAL | 0 refills | Status: AC | PRN

## 2019-04-28 MED ORDER — FENTANYL CITRATE (PF) 100 MCG/2ML IJ SOLN
INTRAMUSCULAR | Status: AC
  Filled 2019-04-28: qty 2

## 2019-04-28 MED ORDER — SODIUM CHLORIDE 0.9 % IV SOLN
INTRAVENOUS | Status: DC | PRN
  Administered 2019-04-28: 12:00:00 25 ug/min via INTRAVENOUS

## 2019-04-28 MED ORDER — LIDOCAINE HCL (CARDIAC) PF 100 MG/5ML IV SOSY
PREFILLED_SYRINGE | INTRAVENOUS | Status: DC | PRN
  Administered 2019-04-28: 100 mg via INTRAVENOUS

## 2019-04-28 MED ORDER — GABAPENTIN 300 MG PO CAPS
ORAL_CAPSULE | ORAL | Status: AC
  Administered 2019-04-28: 300 mg via ORAL
  Filled 2019-04-28: qty 1

## 2019-04-28 MED ORDER — DEXAMETHASONE SODIUM PHOSPHATE 10 MG/ML IJ SOLN
INTRAMUSCULAR | Status: DC | PRN
  Administered 2019-04-28: 10 mg via INTRAVENOUS

## 2019-04-28 MED ORDER — FENTANYL CITRATE (PF) 100 MCG/2ML IJ SOLN
INTRAMUSCULAR | Status: DC | PRN
  Administered 2019-04-28: 50 ug via INTRAVENOUS

## 2019-04-28 MED ORDER — EPHEDRINE SULFATE 50 MG/ML IJ SOLN
INTRAMUSCULAR | Status: DC | PRN
  Administered 2019-04-28: 5 mg via INTRAVENOUS

## 2019-04-28 MED ORDER — MEPERIDINE HCL 50 MG/ML IJ SOLN: 6.2500 mg | INTRAMUSCULAR | Status: DC | PRN

## 2019-04-28 MED ORDER — HYDROMORPHONE HCL 1 MG/ML IJ SOLN
INTRAMUSCULAR | Status: DC | PRN
  Administered 2019-04-28: 0.5 mg via INTRAVENOUS

## 2019-04-28 MED ORDER — ROCURONIUM BROMIDE 100 MG/10ML IV SOLN
INTRAVENOUS | Status: DC | PRN
  Administered 2019-04-28: 40 mg via INTRAVENOUS
  Administered 2019-04-28 (×2): 10 mg via INTRAVENOUS

## 2019-04-28 MED ORDER — KETOROLAC TROMETHAMINE 30 MG/ML IJ SOLN
30.0000 mg | Freq: Once | INTRAMUSCULAR | Status: AC | PRN
  Administered 2019-04-28: 30 mg via INTRAVENOUS

## 2019-04-28 MED ORDER — KETOROLAC TROMETHAMINE 30 MG/ML IJ SOLN
INTRAMUSCULAR | Status: AC
  Filled 2019-04-28: qty 1

## 2019-04-28 MED ORDER — ACETAMINOPHEN 325 MG PO TABS: 325.0000 mg | ORAL_TABLET | ORAL | Status: DC | PRN

## 2019-04-28 MED ORDER — SUGAMMADEX SODIUM 200 MG/2ML IV SOLN
INTRAVENOUS | Status: AC
  Filled 2019-04-28: qty 2

## 2019-04-28 MED ORDER — BUPIVACAINE HCL (PF) 0.5 % IJ SOLN
INTRAMUSCULAR | Status: AC
  Filled 2019-04-28: qty 30

## 2019-04-28 MED ORDER — FAMOTIDINE 20 MG PO TABS
20.0000 mg | ORAL_TABLET | Freq: Once | ORAL | Status: AC
  Administered 2019-04-28: 09:00:00 20 mg via ORAL

## 2019-04-28 MED ORDER — ACETAMINOPHEN 325 MG PO TABS: 650.0000 mg | ORAL_TABLET | Freq: Three times a day (TID) | ORAL | 0 refills | Status: AC | PRN

## 2019-04-28 MED ORDER — LACTATED RINGERS IV SOLN
INTRAVENOUS | Status: DC | PRN
  Administered 2019-04-28 (×2): via INTRAVENOUS

## 2019-04-28 MED ORDER — PHENYLEPHRINE HCL (PRESSORS) 10 MG/ML IV SOLN
INTRAVENOUS | Status: DC | PRN
  Administered 2019-04-28 (×2): 200 ug via INTRAVENOUS
  Administered 2019-04-28 (×3): 100 ug via INTRAVENOUS

## 2019-04-28 MED ORDER — SUGAMMADEX SODIUM 500 MG/5ML IV SOLN
INTRAVENOUS | Status: AC
  Filled 2019-04-28: qty 5

## 2019-04-28 MED ORDER — PROMETHAZINE HCL 25 MG/ML IJ SOLN: 6.2500 mg | INTRAMUSCULAR | Status: DC | PRN

## 2019-04-28 MED ORDER — CHLORHEXIDINE GLUCONATE CLOTH 2 % EX PADS: 6.0000 | MEDICATED_PAD | Freq: Once | CUTANEOUS | Status: DC

## 2019-04-28 MED ORDER — ONDANSETRON HCL 4 MG/2ML IJ SOLN
INTRAMUSCULAR | Status: DC | PRN
  Administered 2019-04-28: 4 mg via INTRAVENOUS

## 2019-04-28 MED ORDER — SEVOFLURANE IN SOLN
RESPIRATORY_TRACT | Status: AC
  Filled 2019-04-28: qty 250

## 2019-04-28 MED ORDER — LIDOCAINE-EPINEPHRINE 1 %-1:100000 IJ SOLN
INTRAMUSCULAR | Status: DC | PRN
  Administered 2019-04-28: 6.5 mL

## 2019-04-28 MED ORDER — CELECOXIB 200 MG PO CAPS
200.0000 mg | ORAL_CAPSULE | ORAL | Status: AC
  Administered 2019-04-28: 09:00:00 200 mg via ORAL

## 2019-04-28 MED ORDER — INDOCYANINE GREEN 25 MG IV SOLR
1.2500 mg | Freq: Once | INTRAVENOUS | Status: AC
  Administered 2019-04-28: 1.25 mg via INTRAVENOUS
  Filled 2019-04-28: qty 25

## 2019-04-28 MED ORDER — HYDROCODONE-ACETAMINOPHEN 5-325 MG PO TABS: 1.0000 | ORAL_TABLET | Freq: Four times a day (QID) | ORAL | 0 refills | Status: DC | PRN

## 2019-04-28 MED ORDER — ACETAMINOPHEN 500 MG PO TABS
ORAL_TABLET | ORAL | Status: AC
  Administered 2019-04-28: 1000 mg via ORAL
  Filled 2019-04-28: qty 2

## 2019-04-28 MED ORDER — IBUPROFEN 800 MG PO TABS: 800.0000 mg | ORAL_TABLET | Freq: Three times a day (TID) | ORAL | 0 refills | Status: DC | PRN

## 2019-04-28 MED ORDER — FAMOTIDINE 20 MG PO TABS
ORAL_TABLET | ORAL | Status: AC
  Administered 2019-04-28: 20 mg via ORAL
  Filled 2019-04-28: qty 1

## 2019-04-28 MED ORDER — ACETAMINOPHEN 160 MG/5ML PO SOLN
325.0000 mg | ORAL | Status: DC | PRN
  Filled 2019-04-28: qty 20.3

## 2019-04-28 MED ORDER — SCOPOLAMINE 1 MG/3DAYS TD PT72
1.0000 | MEDICATED_PATCH | TRANSDERMAL | Status: DC
  Administered 2019-04-28: 1.5 mg via TRANSDERMAL

## 2019-04-28 MED ORDER — MIDAZOLAM HCL 2 MG/2ML IJ SOLN
INTRAMUSCULAR | Status: AC
  Filled 2019-04-28: qty 2

## 2019-04-28 MED ORDER — LACTATED RINGERS IV SOLN
INTRAVENOUS | Status: DC
  Administered 2019-04-28: 09:00:00 via INTRAVENOUS

## 2019-04-28 MED ORDER — BUPIVACAINE HCL (PF) 0.5 % IJ SOLN
INTRAMUSCULAR | Status: DC | PRN
  Administered 2019-04-28: 6.5 mL

## 2019-04-28 SURGICAL SUPPLY — 62 items
ANCHOR TIS RET SYS 235ML (MISCELLANEOUS) ×4 IMPLANT
BAG INFUSER PRESSURE 100CC (MISCELLANEOUS) IMPLANT
BLADE SURG SZ11 CARB STEEL (BLADE) ×4 IMPLANT
CANISTER SUCT 1200ML W/VALVE (MISCELLANEOUS) ×4 IMPLANT
CANNULA REDUC XI 12-8 STAPL (CANNULA) ×1
CANNULA REDUC XI 12-8MM STAPL (CANNULA) ×1
CANNULA REDUCER 12-8 DVNC XI (CANNULA) ×2 IMPLANT
CHLORAPREP W/TINT 26 (MISCELLANEOUS) ×4 IMPLANT
CLIP VESOLOCK MED LG 6/CT (CLIP) ×4 IMPLANT
COVER TIP SHEARS 8 DVNC (MISCELLANEOUS) ×2 IMPLANT
COVER TIP SHEARS 8MM DA VINCI (MISCELLANEOUS) ×2
COVER WAND RF STERILE (DRAPES) ×4 IMPLANT
DECANTER SPIKE VIAL GLASS SM (MISCELLANEOUS) ×8 IMPLANT
DEFOGGER SCOPE WARMER CLEARIFY (MISCELLANEOUS) ×4 IMPLANT
DERMABOND ADVANCED (GAUZE/BANDAGES/DRESSINGS) ×2
DERMABOND ADVANCED .7 DNX12 (GAUZE/BANDAGES/DRESSINGS) ×2 IMPLANT
DRAPE 3/4 80X56 (DRAPES) ×4 IMPLANT
DRAPE ARM DVNC X/XI (DISPOSABLE) ×8 IMPLANT
DRAPE COLUMN DVNC XI (DISPOSABLE) ×2 IMPLANT
DRAPE DA VINCI XI ARM (DISPOSABLE) ×8
DRAPE DA VINCI XI COLUMN (DISPOSABLE) ×2
ELECT CAUTERY BLADE 6.4 (BLADE) ×4 IMPLANT
ELECT REM PT RETURN 9FT ADLT (ELECTROSURGICAL) ×4
ELECTRODE REM PT RTRN 9FT ADLT (ELECTROSURGICAL) ×2 IMPLANT
GLOVE BIOGEL PI IND STRL 7.0 (GLOVE) ×4 IMPLANT
GLOVE BIOGEL PI INDICATOR 7.0 (GLOVE) ×4
GLOVE SURG SYN 6.5 ES PF (GLOVE) ×8 IMPLANT
GOWN STRL REUS W/ TWL LRG LVL3 (GOWN DISPOSABLE) ×6 IMPLANT
GOWN STRL REUS W/TWL LRG LVL3 (GOWN DISPOSABLE) ×6
GRASPER SUT TROCAR 14GX15 (MISCELLANEOUS) ×4 IMPLANT
IRRIGATOR SUCT 8 DISP DVNC XI (IRRIGATION / IRRIGATOR) ×2 IMPLANT
IRRIGATOR SUCTION 8MM XI DISP (IRRIGATION / IRRIGATOR) ×2
IV NS 1000ML (IV SOLUTION) ×2
IV NS 1000ML BAXH (IV SOLUTION) ×2 IMPLANT
KIT PINK PAD W/HEAD ARE REST (MISCELLANEOUS) ×4
KIT PINK PAD W/HEAD ARM REST (MISCELLANEOUS) ×2 IMPLANT
LABEL OR SOLS (LABEL) ×4 IMPLANT
NEEDLE HYPO 22GX1.5 SAFETY (NEEDLE) ×4 IMPLANT
NEEDLE VERESS 14GA 120MM (NEEDLE) ×4 IMPLANT
NS IRRIG 500ML POUR BTL (IV SOLUTION) ×4 IMPLANT
OBTURATOR OPTICAL STANDARD 8MM (TROCAR) ×2
OBTURATOR OPTICAL STND 8 DVNC (TROCAR) ×2
OBTURATOR OPTICALSTD 8 DVNC (TROCAR) ×2 IMPLANT
PACK LAP CHOLECYSTECTOMY (MISCELLANEOUS) ×4 IMPLANT
PENCIL ELECTRO HAND CTR (MISCELLANEOUS) ×4 IMPLANT
SEAL CANN UNIV 5-8 DVNC XI (MISCELLANEOUS) ×6 IMPLANT
SEAL XI 5MM-8MM UNIVERSAL (MISCELLANEOUS) ×6
SOLUTION ELECTROLUBE (MISCELLANEOUS) ×4 IMPLANT
STAPLER CANNULA SEAL DVNC XI (STAPLE) ×2 IMPLANT
STAPLER CANNULA SEAL XI (STAPLE) ×2
SUT MNCRL 4-0 (SUTURE) ×4
SUT MNCRL 4-0 27XMFL (SUTURE) ×4
SUT SILK 3 0 SH 30 (SUTURE) IMPLANT
SUT SILK 3-0 (SUTURE) ×4 IMPLANT
SUT VIC AB 3-0 SH 27 (SUTURE) ×2
SUT VIC AB 3-0 SH 27X BRD (SUTURE) ×2 IMPLANT
SUT VICRYL 0 AB UR-6 (SUTURE) ×4 IMPLANT
SUTURE MNCRL 4-0 27XMF (SUTURE) ×4 IMPLANT
SYR 20ML LL LF (SYRINGE) ×4 IMPLANT
SYSTEM WECK SHIELD CLOSURE (TROCAR) ×4 IMPLANT
TROCAR XCEL NON-BLD 5MMX100MML (ENDOMECHANICALS) ×4 IMPLANT
TUBING EVAC SMOKE HEATED PNEUM (TUBING) ×4 IMPLANT

## 2019-04-28 NOTE — Anesthesia Post-op Follow-up Note (Signed)
Anesthesia QCDR form completed.        

## 2019-04-28 NOTE — Anesthesia Preprocedure Evaluation (Signed)
Anesthesia Evaluation  Patient identified by MRN, date of birth, ID band Patient awake    Reviewed: Allergy & Precautions, H&P , NPO status , reviewed documented beta blocker date and time   History of Anesthesia Complications (+) PONV and history of anesthetic complications  Airway Mallampati: III  TM Distance: >3 FB Neck ROM: full    Dental  (+) Poor Dentition, Chipped, Missing   Pulmonary Current Smoker and Patient abstained from smoking.,    Pulmonary exam normal        Cardiovascular hypertension, Normal cardiovascular exam     Neuro/Psych    GI/Hepatic neg GERD  ,  Endo/Other    Renal/GU      Musculoskeletal   Abdominal   Peds  Hematology   Anesthesia Other Findings Past Medical History: No date: Hypercholesteremia No date: Hypertension No date: PONV (postoperative nausea and vomiting) Past Surgical History: No date: ABDOMINAL HYSTERECTOMY No date: TONSILLECTOMY   Reproductive/Obstetrics                             Anesthesia Physical Anesthesia Plan  ASA: II  Anesthesia Plan: General   Post-op Pain Management:    Induction: Intravenous  PONV Risk Score and Plan: 4 or greater and Ondansetron, Dexamethasone, Midazolam and Scopolamine patch - Pre-op  Airway Management Planned: Oral ETT  Additional Equipment:   Intra-op Plan:   Post-operative Plan: Extubation in OR  Informed Consent: I have reviewed the patients History and Physical, chart, labs and discussed the procedure including the risks, benefits and alternatives for the proposed anesthesia with the patient or authorized representative who has indicated his/her understanding and acceptance.     Dental Advisory Given  Plan Discussed with: CRNA  Anesthesia Plan Comments:         Anesthesia Quick Evaluation

## 2019-04-28 NOTE — Transfer of Care (Signed)
Immediate Anesthesia Transfer of Care Note  Patient: Teresa Vaughn  Procedure(s) Performed: XI ROBOTIC ASSISTED LAPAROSCOPIC CHOLECYSTECTOMY (N/A Abdomen) INDOCYANINE GREEN FLUORESCENCE IMAGING (ICG)  Patient Location: PACU  Anesthesia Type:General  Level of Consciousness: drowsy, patient cooperative and responds to stimulation  Airway & Oxygen Therapy: Patient Spontanous Breathing and Patient connected to face mask oxygen  Post-op Assessment: Report given to RN and Post -op Vital signs reviewed and stable  Post vital signs: Reviewed and stable  Last Vitals:  Vitals Value Taken Time  BP 141/81 04/28/19 1320  Temp 36.7 C 04/28/19 1320  Pulse 79 04/28/19 1325  Resp 17 04/28/19 1325  SpO2 100 % 04/28/19 1325  Vitals shown include unvalidated device data.  Last Pain:  Vitals:   04/28/19 1320  TempSrc:   PainSc: Asleep         Complications: No apparent anesthesia complications

## 2019-04-28 NOTE — Discharge Instructions (Signed)
  AMBULATORY SURGERY  DISCHARGE INSTRUCTIONS   1) The drugs that you were given will stay in your system until tomorrow so for the next 24 hours you should not:  A) Drive an automobile B) Make any legal decisions C) Drink any alcoholic beverage   2) You may resume regular meals tomorrow.  Today it is better to start with liquids and gradually work up to solid foods.  You may eat anything you prefer, but it is better to start with liquids, then soup and crackers, and gradually work up to solid foods.   3) Please notify your doctor immediately if you have any unusual bleeding, trouble breathing, redness and pain at the surgery site, drainage, fever, or pain not relieved by medication.    4) Additional Instructions:        Please contact your physician with any problems or Same Day Surgery at [phone], Monday through Friday 6 am to 4 pm, or  at [facility] number at [phone].Laparoscopic Cholecystectomy, Care After This sheet gives you information about how to care for yourself after your procedure. Your doctor may also give you more specific instructions. If you have problems or questions, contact your doctor. Follow these instructions at home: Care for cuts from surgery (incisions)   Follow instructions from your doctor about how to take care of your cuts from surgery. Make sure you: ? Wash your hands with soap and water before you change your bandage (dressing). If you cannot use soap and water, use hand sanitizer. ? Change your bandage as told by your doctor. ? Leave stitches (sutures), skin glue, or skin tape (adhesive) strips in place. They may need to stay in place for 2 weeks or longer. If tape strips get loose and curl up, you may trim the loose edges. Do not remove tape strips completely unless your doctor says it is okay.  Do not take baths, swim, or use a hot tub until your doctor says it is okay. OK TO SHOWER 24HRS AFTER YOUR SURGERY.    Check your surgical cut area every day for signs of infection. Check for: ? More redness, swelling, or pain. ? More fluid or blood. ? Warmth. ? Pus or a bad smell. Activity  Do not drive or use heavy machinery while taking prescription pain medicine.  Do not play contact sports until your doctor says it is okay.  Do not drive for 24 hours if you were given a medicine to help you relax (sedative).  Rest as needed. Do not return to work or school until your doctor says it is okay. General instructions .  tylenol and advil as needed for discomfort.  Please alternate between the two every four hours as needed for pain.   .  Use narcotics, if prescribed, only when tylenol and motrin is not enough to control pain. .  325-650mg every 8hrs to max of 3000mg/24hrs (including the 325mg in every norco dose) for the tylenol.   .  Advil up to 800mg per dose every 8hrs as needed for pain.    To prevent or treat constipation while you are taking prescription pain medicine, your doctor may recommend that you: ? Drink enough fluid to keep your pee (urine) clear or pale yellow. ? Take over-the-counter or prescription medicines. ? Eat foods that are high in fiber, such as fresh fruits and vegetables, whole grains, and beans. ? Limit foods that are high in fat and processed sugars, such as fried and sweet foods. Contact   if:  You develop a rash.  You have more redness, swelling, or pain around your surgical cuts.  You have more fluid or blood coming from your surgical cuts.  Your surgical cuts feel warm to the touch.  You have pus or a bad smell coming from your surgical cuts.  You have a fever.  One or more of your surgical cuts breaks open. Get help right away if:  You have trouble breathing.  You have chest pain.  You have pain that is getting worse in your shoulders.  You faint or feel dizzy when you stand.  You have very bad pain in your belly (abdomen).  You are sick to your stomach  (nauseous) for more than one day.  You have throwing up (vomiting) that lasts for more than one day.  You have leg pain. This information is not intended to replace advice given to you by your health care provider. Make sure you discuss any questions you have with your health care provider. Document Released: 03/11/2008 Document Revised: 12/22/2015 Document Reviewed: 11/19/2015 Elsevier Interactive Patient Education  2019 [place]   5) The drugs that you were given will stay in your system until tomorrow so for the next 24 hours you should not:  D) Drive an automobile E) Make any legal decisions F) Drink any alcoholic beverage   6) You may resume regular meals tomorrow.  Today it is better to start with liquids and gradually work up to solid foods.  You may eat anything you prefer, but it is better to start with liquids, then soup and crackers, and gradually work up to solid foods.   7) Please notify your doctor immediately if you have any unusual bleeding, trouble breathing, redness and pain at the surgery site, drainage, fever, or pain not relieved by medication.    8) Additional Instructions:        Please contact your physician with any problems or Same Day Surgery at [phone], Monday through Friday 6 am to 4 pm, or [place] at [facility] number at [phone].

## 2019-04-28 NOTE — Op Note (Signed)
Preoperative diagnosis:  chronic and cholecystitis  Postoperative diagnosis: same as above  Procedure: Robotic assisted Laparoscopic Cholecystectomy.   Anesthesia: GETA   Surgeon: Benjamine Sprague  Specimen: Gallbladder  Complications: None  EBL: 72mL  Wound Classification: Clean Contaminated  Indications: see HPI  Findings: Critical view of safety noted Cystic duct and artery identified, ligated and divided, clips remained intact at end of procedure Adequate hemostasis  Description of procedure:  The patient was placed on the operating table in the supine position. SCDs placed, pre-op abx administered.  General anesthesia was induced and OG tube placed by anesthesia. A time-out was completed verifying correct patient, procedure, site, positioning, and implant(s) and/or special equipment prior to beginning this procedure. The abdomen was prepped and draped in the usual sterile fashion.    Veress needle was placed at the Palmer's point and insufflation was started after confirming a positive saline drop test and no immediate increase in abdominal pressure.  After reaching 15 mm, the Veress needle was removed and a 5 mm port was placed through the same spot via optiview technique.    The abdomen was inspected and no abnormalities or injuries were found.  Under direct vision, ports were placed in the following locations: One 12 mm port at the umbilicus, 20 cm from the gallbladder, two 8 mm ports placed to the patient left of the umbilical port 8 cm apart.  1 additional 8 mm port placed right of umbilical port.  Once ports were placed, the Xi platform was brought into the operative field and docked to the ports successfully.  An endoscope was placed through the umbilical port, fenestrated grasper through the adjacent patient right port, prograsp to the far patient left port, and then a hook cautery in the left port.   The table was placed in the reverse Trendelenburg position with the right side  up.  The dome of the gallbladder was grasped with prograsp, passed and retracted over the dome of the liver. Extensive adhesions between the gallbladder and omentum, duodenum and transverse colon were lysed via hook cautery. The infundibulum was grasped with the fenestrated grasper and retracted toward the right lower quadrant. This maneuver exposed Calot's triangle. The peritoneum overlying the gallbladder infundibulum was then dissected using combination of [place] dissector and electrocautery hook and the cystic duct and cystic artery eventually identified.  Critical view of safety with the liver bed clearly visible behind the duct and artery with no additional structures noted.  The cystic duct and cystic artery clipped and divided close to the gallbladder.  1 robotic clip was placed at the base of the cystic duct instead of 2 due to the very short length, one clip for the cystic artery.   The gallbladder was then dissected from its peritoneal and liver bed attachments by electrocautery.  Spill bile was suctioned completely out and the area irrigated.  Hemostasis was checked prior to removing the pro-grasp retractor and placing the endoscope through the port it was in.  The Endo Catch bag was then placed through the 12 mm port site at the umbilicus and the gallbladder was removed.  The gallbladder was passed off the table as a specimen. There was no evidence of bleeding from the gallbladder fossa or cystic artery or leakage of the bile from the cystic duct stump. The umbilical port site closed with a Efx Shield device using 0 vicryl under direct vision.  Abdomen desufflated and secondary trocars were removed under direct vision. No bleeding was noted. All skin  incisions then closed with subcuticular sutures of 4-0 monocryl and dressed with topical skin adhesive. The orogastric tube was removed and patient extubated.  The patient tolerated the procedure well and was taken to the postanesthesia care unit in  stable condition.  All sponge and instrument count correct at end of procedure.

## 2019-04-28 NOTE — Interval H&P Note (Signed)
History and Physical Interval Note:  04/28/2019 10:29 AM  Teresa Vaughn  has presented today for surgery, with the diagnosis of S06.30 Biliary Colic.  The various methods of treatment have been discussed with the patient and family. After consideration of risks, benefits and other options for treatment, the patient has consented to  Procedure(s): XI ROBOTIC ASSISTED LAPAROSCOPIC CHOLECYSTECTOMY (N/A) [place] (ICG) as a surgical intervention.  The patient's history has been reviewed, patient examined, no change in status, stable for surgery.  I have reviewed the patient's chart and labs.  Questions were answered to the patient's satisfaction.     Annaya Bangert Lysle Pearl

## 2019-04-28 NOTE — Anesthesia Procedure Notes (Signed)
Procedure Name: Intubation Performed by: Fletcher-Harrison, Mikhala Kenan, CRNA Pre-anesthesia Checklist: Patient identified, Emergency Drugs available, Suction available and Patient being monitored Patient Re-evaluated:Patient Re-evaluated prior to induction Oxygen Delivery Method: Circle system utilized Preoxygenation: Pre-oxygenation with 100% oxygen Induction Type: IV induction Ventilation: Mask ventilation without difficulty Laryngoscope Size: McGraph and 3 Grade View: Grade I Tube type: Oral Tube size: 6.5 mm Number of attempts: 1 Airway Equipment and Method: Stylet Placement Confirmation: ETT inserted through vocal cords under direct vision,  positive ETCO2,  CO2 detector and breath sounds checked- equal and bilateral Secured at: 21 cm Tube secured with: Tape Dental Injury: Teeth and Oropharynx as per pre-operative assessment        

## 2019-04-29 LAB — SURGICAL PATHOLOGY

## 2019-05-05 NOTE — Anesthesia Postprocedure Evaluation (Signed)
Anesthesia Post Note  Patient: Teresa Vaughn  Procedure(s) Performed: XI ROBOTIC ASSISTED LAPAROSCOPIC CHOLECYSTECTOMY (N/A Abdomen) INDOCYANINE GREEN FLUORESCENCE IMAGING (ICG)  Patient location during evaluation: PACU Anesthesia Type: General Level of consciousness: awake and alert Pain management: pain level controlled Vital Signs Assessment: post-procedure vital signs reviewed and stable Respiratory status: spontaneous breathing, nonlabored ventilation, respiratory function stable and patient connected to nasal cannula oxygen Cardiovascular status: blood pressure returned to baseline and stable Postop Assessment: no apparent nausea or vomiting Anesthetic complications: no     Last Vitals:  Vitals:   04/28/19 1603 04/28/19 1613  BP: 117/70 (!) 142/72  Pulse: 85 85  Resp: (!) 23 16  Temp: (!) 36.2 C 36.5 C  SpO2: 94% 97%    Last Pain:  Vitals:   04/28/19 1613  TempSrc: Tympanic  PainSc: 3                  Marleta Lapierre Harvie Heck

## 2019-05-06 NOTE — H&P (Signed)
Subjective:   CC: Biliary colic [K80.50]  HPI:  Teresa Vaughn is a 58 y.o. female who was referred by Margarito Liner* for evaluation of above CC. Symptoms were first noted a few months ago. Pain is intermittent and severe, radiating to back, sometimes down into her RLQ, usually located in right upper quadrant, without radiation.  Associated with loose stools, exacerbated by nothing specific.  Occurs at night.  Incidentally noted anal nodule on CT.  Asymptomatic.  Never had colonoscopy.  Past Medical History:  has a past medical history of Hypertension.  Past Surgical History:  has a past surgical history that includes Hysterectomy and Tonsillectomy.  Family History: family history includes High blood pressure (Hypertension) in her father and mother.  Social History:  reports that she has been smoking. She has never used smokeless tobacco. She reports current alcohol use. She reports that she does not use drugs.  Current Medications: has a current medication list which includes the following prescription(s): amlodipine, cyanocobalamin, fenofibrate nanocrystallized, and meloxicam.  Allergies:       Allergies as of 04/19/2019 - Reviewed 04/19/2019  Allergen Reaction Noted  . Lisinopril Cough 02/01/2016  . Penicillin Hives 02/01/2016    ROS:  A 15 point review of systems was performed and pertinent positives and negatives noted in HPI    Objective:   BP (!) 172/99   Pulse 108   Ht 160 cm (5\' 3" )   Wt 73.5 kg (162 lb)   LMP  (LMP Unknown)   BMI 28.70 kg/m    Constitutional :  alert, appears stated age, cooperative and no distress  Lymphatics/Throat:  no asymmetry, masses, or scars  Respiratory:  clear to auscultation bilaterally  Cardiovascular:  regular rate and rhythm  Gastrointestinal: soft, non-tender; bowel sounds normal; no masses,  no organomegaly.    Musculoskeletal: Steady gait and movement  Skin: Cool and moist  Psychiatric: Normal  affect, non-agitated, not confused  Rectal: Chaperone present for exam.  External exam revealed possibly a internal hemorrhoid  DRE revealed, normal rectal tone, with palpable internal hemorrhoid noted.  Limited exam due to patient discomfort.        LABS:  n/a   RADS: CLINICAL DATA: 58 year old female with right lower quadrant abdominal pain.  EXAM: CT ABDOMEN AND PELVIS WITH CONTRAST  TECHNIQUE: Multidetector CT imaging of the abdomen and pelvis was performed using the standard protocol following bolus administration of intravenous contrast.  CONTRAST: 59 OMNIPAQUE IOHEXOL 300 MG/ML SOLN  COMPARISON: Abdominal ultrasound dated 11/11/2017.  FINDINGS: Lower chest: The visualized lung bases are clear.  No intra-abdominal free air or free fluid.  Hepatobiliary: Apparent fatty infiltration of the liver. No intrahepatic biliary ductal dilatation. There multiple gallstones. No pericholecystic fluid or evidence of acute cholecystitis by CT.  Pancreas: Unremarkable. No pancreatic ductal dilatation or surrounding inflammatory changes.  Spleen: Normal in size without focal abnormality.  Adrenals/Urinary Tract: The adrenal glands are unremarkable. 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 The left kidney is unremarkable. There is no hydronephrosis on either side. There is symmetric enhancement and excretion of contrast by both kidneys. Subcentimeter left renal hypodense lesions are too small to characterize. The visualized ureters and urinary bladder appear unremarkable.  Stomach/Bowel: Nodular soft tissue thickening at the anal orifice is not well evaluated but may represent hemorrhoids. Clinical correlation is recommended. There is no bowel obstruction or active inflammation. The appendix is normal.  Vascular/Lymphatic: The abdominal aorta and IVC are unremarkable. No portal venous gas.  There is no  adenopathy.  Reproductive: Hysterectomy. No adnexal masses.  Other: None  Musculoskeletal: Lower lumbar facet arthropathy. No acute osseous pathology.  IMPRESSION: 1. No acute intra-abdominal or pelvic pathology. No bowel obstruction or active inflammation. Normal appendix. 2. Cholelithiasis. 3. Fatty liver. 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 5. Nodular soft tissue thickening at the anal orifice is not well evaluated but may represent hemorrhoids. Clinical correlation is recommended.   Electronically Signed By: Anner Crete M.D. On: 03/30/2019 23:32   Assessment:      Biliary colic [Y65.99]  Plan:   1. Biliary colic [J57.01] Discussed the risk of surgery including post-op infxn, seroma, biloma, chronic pain, poor-delayed wound healing, retained gallstone, conversion to open procedure, post-op SBO or ileus, and need for additional procedures to address said risks.  The risks of general anesthetic including MI, CVA, sudden death or even reaction to anesthetic medications also discussed. Alternatives include continued observation.  Benefits include possible symptom relief, prevention of complications including acute cholecystitis, pancreatitis.  Typical post operative recovery of 3-5 days rest, continued pain in area and incision sites, possible loose stools up to 4-6 weeks, also discussed.  ED return precautions given for sudden increase in RUQ pain, with possible accompanying fever, nausea, and/or vomiting.  The patient understands the risks, any and all questions were answered to the patient's satisfaction.  Pt is agreeable to proceed.  She requested I fill out FMLA paperwork to keep her out of work until then.  I explained to her that since she has atypical pain presentation and no objective findings of biliary issues, I will not fill out her paperwork at this time, and patient's do not need FMLA paperwork filled out to  recover from the surgery either, due to the limited days need as noted above.  She verbalized understanding.  Regarding the nodule thickening at anus noted on CT, DRE was limited due to patient discomfort and since she is due for screening colonoscopy, recommended we proceed with colonoscopy after recovery from lap chole. Risk benefits alternatives discussed.  Risks include but not limited to bleeding and perforation.  Benefits include diagnostic evaluation and possible curative procedure.  Alternatives include continued observation.   Pt agreeable to proceed with both procedures.

## 2019-05-23 ENCOUNTER — Other Ambulatory Visit: Payer: Self-pay

## 2019-05-23 ENCOUNTER — Other Ambulatory Visit
Admission: RE | Admit: 2019-05-23 | Discharge: 2019-05-23 | Disposition: A | Discharge status: Home / Self Care | Payer: BC Managed Care – PPO | Source: Ambulatory Visit | Attending: Surgery | Admitting: Surgery

## 2019-05-23 DIAGNOSIS — Z01812 Encounter for preprocedural laboratory examination: Secondary | ICD-10-CM | POA: Insufficient documentation

## 2019-05-23 DIAGNOSIS — Z20828 Contact with and (suspected) exposure to other viral communicable diseases: Secondary | ICD-10-CM | POA: Diagnosis not present

## 2019-05-24 LAB — SARS CORONAVIRUS 2 (TAT 6-24 HRS): SARS Coronavirus 2: NEGATIVE

## 2019-05-26 ENCOUNTER — Encounter: Payer: Self-pay | Admitting: Surgery

## 2019-05-26 ENCOUNTER — Other Ambulatory Visit: Payer: Self-pay

## 2019-05-26 ENCOUNTER — Ambulatory Visit: Payer: BC Managed Care – PPO | Admitting: Anesthesiology

## 2019-05-26 ENCOUNTER — Encounter
Admission: RE | Disposition: A | Discharge status: Home / Self Care | Payer: Self-pay | Source: Home / Self Care | Attending: Surgery

## 2019-05-26 ENCOUNTER — Ambulatory Visit
Admission: RE | Admit: 2019-05-26 | Discharge: 2019-05-26 | Disposition: A | Discharge status: Home / Self Care | Payer: BC Managed Care – PPO | Attending: Surgery | Admitting: Surgery

## 2019-05-26 DIAGNOSIS — E7800 Pure hypercholesterolemia, unspecified: Secondary | ICD-10-CM | POA: Insufficient documentation

## 2019-05-26 DIAGNOSIS — K8050 Calculus of bile duct without cholangitis or cholecystitis without obstruction: Secondary | ICD-10-CM | POA: Insufficient documentation

## 2019-05-26 DIAGNOSIS — F17200 Nicotine dependence, unspecified, uncomplicated: Secondary | ICD-10-CM | POA: Diagnosis not present

## 2019-05-26 DIAGNOSIS — R197 Diarrhea, unspecified: Secondary | ICD-10-CM | POA: Diagnosis not present

## 2019-05-26 DIAGNOSIS — I10 Essential (primary) hypertension: Secondary | ICD-10-CM | POA: Diagnosis not present

## 2019-05-26 DIAGNOSIS — Z888 Allergy status to other drugs, medicaments and biological substances status: Secondary | ICD-10-CM | POA: Diagnosis not present

## 2019-05-26 DIAGNOSIS — R1031 Right lower quadrant pain: Secondary | ICD-10-CM | POA: Insufficient documentation

## 2019-05-26 DIAGNOSIS — R1011 Right upper quadrant pain: Secondary | ICD-10-CM | POA: Insufficient documentation

## 2019-05-26 DIAGNOSIS — Z8249 Family history of ischemic heart disease and other diseases of the circulatory system: Secondary | ICD-10-CM | POA: Diagnosis not present

## 2019-05-26 DIAGNOSIS — Z79899 Other long term (current) drug therapy: Secondary | ICD-10-CM | POA: Diagnosis not present

## 2019-05-26 DIAGNOSIS — Z880 Allergy status to penicillin: Secondary | ICD-10-CM | POA: Diagnosis not present

## 2019-05-26 DIAGNOSIS — Z791 Long term (current) use of non-steroidal anti-inflammatories (NSAID): Secondary | ICD-10-CM | POA: Insufficient documentation

## 2019-05-26 DIAGNOSIS — K641 Second degree hemorrhoids: Secondary | ICD-10-CM | POA: Diagnosis not present

## 2019-05-26 HISTORY — PX: COLONOSCOPY WITH PROPOFOL: SHX5780

## 2019-05-26 SURGERY — COLONOSCOPY WITH PROPOFOL
Anesthesia: General

## 2019-05-26 SURGERY — CANCELLED PROCEDURE
Anesthesia: General

## 2019-05-26 MED ORDER — MIDAZOLAM HCL 5 MG/5ML IJ SOLN
INTRAMUSCULAR | Status: DC | PRN
  Administered 2019-05-26: 1 mg via INTRAVENOUS

## 2019-05-26 MED ORDER — SODIUM CHLORIDE 0.9 % IV SOLN: INTRAVENOUS | Status: DC

## 2019-05-26 MED ORDER — PROPOFOL 500 MG/50ML IV EMUL
INTRAVENOUS | Status: DC | PRN
  Administered 2019-05-26: 140 ug/kg/min via INTRAVENOUS

## 2019-05-26 MED ORDER — PROPOFOL 10 MG/ML IV BOLUS
INTRAVENOUS | Status: DC | PRN
  Administered 2019-05-26 (×2): 50 mg via INTRAVENOUS

## 2019-05-26 MED ORDER — MIDAZOLAM HCL 2 MG/2ML IJ SOLN
INTRAMUSCULAR | Status: AC
  Filled 2019-05-26: qty 2

## 2019-05-26 MED ORDER — PROPOFOL 500 MG/50ML IV EMUL
INTRAVENOUS | Status: AC
  Filled 2019-05-26: qty 50

## 2019-05-26 NOTE — Op Note (Signed)
[facility] Gastroenterology Patient Name: Teresa Vaughn Procedure Date: 05/26/2019 2:37 PM MRN: [id number] Account #: [id number] Date of Birth: 06-12-1961 Admit Type: Outpatient Age: 58 Room: [facility] ENDO ROOM 3 Gender: Female Note Status: Finalized Procedure:             Colonoscopy Indications:           Screening for colorectal malignant neoplasm Providers:             Arville Go MD, MD Referring MD:          Barbette Reichmann, MD (Referring MD) Medicines:             Propofol per Anesthesia Complications:         No immediate complications. Procedure:             Pre-Anesthesia Assessment:                        - After reviewing the risks and benefits, the patient                         was deemed in satisfactory condition to undergo the                         procedure in an ambulatory setting.                        After obtaining informed consent, the colonoscope was                         passed under direct vision. Throughout the procedure,                         the patient's blood pressure, pulse, and oxygen                         saturations were monitored continuously. The                         Colonoscope was introduced through the anus and                         advanced to the the cecum, identified by the ileocecal                         valve. The colonoscopy was performed without                         difficulty. The patient tolerated the procedure well.                         The quality of the bowel preparation was good. Findings:      The perianal and digital rectal examinations were normal.      Non-bleeding internal hemorrhoids were found during retroflexion. The       hemorrhoids were Grade II (internal hemorrhoids that prolapse but reduce       spontaneously).      The exam was otherwise without abnormality. Impression:            - Non-bleeding internal hemorrhoids.                        -  The examination was otherwise  normal.                        - No specimens collected. Recommendation:        - Written discharge instructions were provided to the                         patient.                        - Resume regular diet.                        - Discharge patient to home.                        - Repeat colonoscopy in 10 years for screening                         purposes. Procedure Code(s):     --- Professional ---                        V4008, Colorectal cancer screening; colonoscopy on                         individual not meeting criteria for high risk Diagnosis Code(s):     --- Professional ---                        Z12.11, Encounter for screening for malignant neoplasm                         of colon                        K64.1, Second degree hemorrhoids CPT copyright 2019 American Medical Association. All rights reserved. The codes documented in this report are preliminary and upon coder review may  be revised to meet current compliance requirements. Dr. Sheppard Penton, MD Eliseo Squires MD, MD 05/26/2019 3:31:39 PM This report has been signed electronically. Number of Addenda: 0 Note Initiated On: 05/26/2019 2:37 PM Scope Withdrawal Time: 0 hours 6 minutes 31 seconds  Total Procedure Duration: 0 hours 14 minutes 33 seconds  Estimated Blood Loss:  Estimated blood loss: none.      [facility]

## 2019-05-26 NOTE — Interval H&P Note (Signed)
History and Physical Interval Note:  05/26/2019 2:52 PM  Teresa Vaughn  has presented today for surgery, with the diagnosis of colon cancer screening.  The various methods of treatment have been discussed with the patient and family. After consideration of risks, benefits and other options for treatment, the patient has consented to  Procedure(s): COLONOSCOPY WITH PROPOFOL (N/A) as a surgical intervention.  The patient's history has been reviewed, patient examined, no change in status, stable for surgery.  I have reviewed the patient's chart and labs.  Questions were answered to the patient's satisfaction.     Aspen Deterding Lysle Pearl

## 2019-05-26 NOTE — Transfer of Care (Signed)
Immediate Anesthesia Transfer of Care Note  Patient: Teresa Vaughn  Procedure(s) Performed: COLONOSCOPY WITH PROPOFOL (N/A )  Patient Location: PACU  Anesthesia Type:General  Level of Consciousness: sedated  Airway & Oxygen Therapy: Patient Spontanous Breathing  Post-op Assessment: Report given to RN and Post -op Vital signs reviewed and stable  Post vital signs: Reviewed and stable  Last Vitals:  Vitals Value Taken Time  BP 116/75 05/26/19 1533  Temp 36.4 C 05/26/19 1531  Pulse 92 05/26/19 1533  Resp 27 05/26/19 1533  SpO2 98 % 05/26/19 1533    Last Pain:  Vitals:   05/26/19 1531  TempSrc:   PainSc: Asleep         Complications: No apparent anesthesia complications

## 2019-05-26 NOTE — Anesthesia Postprocedure Evaluation (Signed)
Anesthesia Post Note  Patient: Teresa Vaughn  Procedure(s) Performed: COLONOSCOPY WITH PROPOFOL (N/A )  Patient location during evaluation: Endoscopy Anesthesia Type: General Level of consciousness: awake and alert Pain management: pain level controlled Vital Signs Assessment: post-procedure vital signs reviewed and stable Respiratory status: spontaneous breathing and respiratory function stable Cardiovascular status: stable Anesthetic complications: no     Last Vitals:  Vitals:   05/26/19 1551 05/26/19 1600  BP: 110/85 129/78  Pulse: 81 71  Resp: 19 16  Temp:    SpO2: 100% 100%    Last Pain:  Vitals:   05/26/19 1600  TempSrc:   PainSc: 0-No pain                 Hazeline Charnley K

## 2019-05-26 NOTE — Anesthesia Post-op Follow-up Note (Signed)
Anesthesia QCDR form completed.        

## 2019-05-26 NOTE — Anesthesia Preprocedure Evaluation (Addendum)
Anesthesia Evaluation  Patient identified by MRN, date of birth, ID band Patient awake    Reviewed: Allergy & Precautions, H&P , NPO status , reviewed documented beta blocker date and time   Airway Mallampati: II  TM Distance: >3 FB Neck ROM: full    Dental  (+) Chipped, Missing, Poor Dentition   Pulmonary Current Smoker and Patient abstained from smoking.,    Pulmonary exam normal        Cardiovascular hypertension, Normal cardiovascular exam     Neuro/Psych    GI/Hepatic   Endo/Other    Renal/GU      Musculoskeletal   Abdominal   Peds  Hematology   Anesthesia Other Findings Past Medical History: No date: Hypercholesteremia No date: Hypertension No date: PONV (postoperative nausea and vomiting)  Past Surgical History: No date: ABDOMINAL HYSTERECTOMY No date: TONSILLECTOMY 04/28/2019 Robotic Choly, GOT, "slow wake up" otherwise no anesthesia problems  BMI    Body Mass Index: 28.74 kg/m      Reproductive/Obstetrics                             Anesthesia Physical Anesthesia Plan  ASA: II  Anesthesia Plan: General   Post-op Pain Management:    Induction: Intravenous  PONV Risk Score and Plan: Treatment may vary due to age or medical condition and TIVA  Airway Management Planned: Nasal Cannula and Natural Airway  Additional Equipment:   Intra-op Plan:   Post-operative Plan:   Informed Consent: I have reviewed the patients History and Physical, chart, labs and discussed the procedure including the risks, benefits and alternatives for the proposed anesthesia with the patient or authorized representative who has indicated his/her understanding and acceptance.     Dental Advisory Given  Plan Discussed with: CRNA  Anesthesia Plan Comments:        Anesthesia Quick Evaluation

## 2019-05-27 ENCOUNTER — Encounter: Payer: Self-pay | Admitting: *Deleted

## 2019-07-29 ENCOUNTER — Ambulatory Visit (INDEPENDENT_AMBULATORY_CARE_PROVIDER_SITE_OTHER): Payer: BC Managed Care – PPO

## 2019-07-29 ENCOUNTER — Other Ambulatory Visit: Payer: Self-pay

## 2019-07-29 ENCOUNTER — Ambulatory Visit
Admission: EM | Admit: 2019-07-29 | Discharge: 2019-07-29 | Disposition: A | Discharge status: Home / Self Care | Payer: BC Managed Care – PPO | Attending: Urgent Care | Admitting: Urgent Care

## 2019-07-29 DIAGNOSIS — M79672 Pain in left foot: Secondary | ICD-10-CM

## 2019-07-29 DIAGNOSIS — R2242 Localized swelling, mass and lump, left lower limb: Secondary | ICD-10-CM

## 2019-07-29 DIAGNOSIS — M7989 Other specified soft tissue disorders: Secondary | ICD-10-CM

## 2019-07-29 DIAGNOSIS — Z8739 Personal history of other diseases of the musculoskeletal system and connective tissue: Secondary | ICD-10-CM | POA: Diagnosis not present

## 2019-07-29 MED ORDER — PREDNISONE 10 MG (21) PO TBPK: ORAL_TABLET | Freq: Every day | ORAL | 0 refills | Status: DC

## 2019-07-29 NOTE — Discharge Instructions (Signed)
It was very nice seeing you today in clinic. Thank you for entrusting me with your care.   REST, ICE, and ELEVATE foot. Use medications as prescribed.   Make arrangements to follow up with your regular doctor in 1 week for re-evaluation if not improving. If your symptoms/condition worsens, please seek follow up care either here or in the ER. Please remember, our [facility] providers are "right here with you" when you need [place].   Again, it was my pleasure to take care of you today. Thank you for choosing our clinic. I hope that you start to feel better quickly.   Quentin Mulling, MSN, APRN, FNP-C, CEN Advanced Practice Provider [facility] [place] [facility]

## 2019-07-29 NOTE — ED Triage Notes (Addendum)
Pt presents with c/o pain and swelling to 2nd toe on her left foot for the past month. She denies any known injury. She does have pain with ambulation and some difficulty with ROM. Pt does report history of gout in right foot.

## 2019-07-30 NOTE — ED Provider Notes (Signed)
[place], [employer]   Name: Teresa Vaughn DOB: Jan 04, 1961 MRN: [id number] CSN: [id number] PCP: Barbette Reichmann, MD  Arrival date and time:  07/29/19 1306  Chief Complaint:  Toe Pain (2nd, left foot)   NOTE: Prior to seeing the patient today, I have reviewed the triage nursing documentation and vital signs. Clinical staff has updated patient's PMH/PSHx, current medication list, and drug allergies/intolerances to ensure comprehensive history available to assist in medical decision making.   History:   HPI: Teresa Vaughn is a 59 y.o. female who presents today with complaints of pain and swelling to the second digit on her LEFT foot that has been going on for the last month.  Patient denies injury. She advises that normal ROM exercises and ambulation causes pain to worsen.  PMH (+) gout and this feels similar per her report. Denies previous injuries or surgeries to her foot. Despite her symptoms, patient has not taken any over the counter interventions to help improve/relieve her reported symptoms at home.   Past Medical History:  Diagnosis Date  . Hypercholesteremia   . Hypertension   . PONV (postoperative nausea and vomiting)     Past Surgical History:  Procedure Laterality Date  . ABDOMINAL HYSTERECTOMY    . CHOLECYSTECTOMY    . COLONOSCOPY WITH PROPOFOL N/A 05/26/2019   Procedure: COLONOSCOPY WITH PROPOFOL;  Surgeon: Sung Amabile, DO;  Location: [facility] ENDOSCOPY;  Service: General;  Laterality: N/A;  . TONSILLECTOMY      Family History  Problem Relation Age of Onset  . Breast cancer Paternal Aunt 105  . Hypertension Mother   . Liver cancer Mother   . Hypertension Father   . Bone cancer Father     Social History   Tobacco Use  . Smoking status: Current Every Day Smoker    Packs/day: 0.50    Types: Cigarettes    Start date: 07/07/1989  . Smokeless tobacco: Never Used  Substance Use Topics  . Alcohol use: Yes    Alcohol/week: 7.0 standard drinks    Types: 7  Standard drinks or equivalent per week  . Drug use: Never    There are no problems to display for this patient.   Home Medications:    Current Meds  Medication Sig  . amLODipine (NORVASC) 5 MG tablet Take 5 mg by mouth daily.   Marland Kitchen atorvastatin (LIPITOR) 40 MG tablet Take 40 mg by mouth daily.  . fenofibrate (TRICOR) 48 MG tablet Take 1 tablet by mouth daily.  . vitamin B-12 (CYANOCOBALAMIN) 1000 MCG tablet Take 1,000 mcg by mouth daily.   . [DISCONTINUED] vitamin E 200 UNIT capsule Take 200 Units by mouth daily.     Allergies:   Lisinopril and Penicillins  Review of Systems (ROS): Review of Systems  Constitutional: Negative for chills and fever.  Respiratory: Negative for cough and shortness of breath.   Cardiovascular: Negative for chest pain and palpitations.  Musculoskeletal:       Acute pain and swelling to LEFT foot  Skin: Negative for color change, pallor and rash.  Neurological: Negative for weakness and numbness.  All other systems reviewed and are negative.    Vital Signs: Today's Vitals   07/29/19 1311 07/29/19 1314 07/29/19 1409  BP:  (!) 162/92   Pulse:  84   Temp:  98.2 F (36.8 C)   TempSrc:  Oral   SpO2:  100%   Weight: 160 lb (72.6 kg)    Height: 5\' 3"  (1.6 m)    PainSc:  9   9     Physical Exam: Physical Exam  Constitutional: She is oriented to person, place, and time and well-developed, well-nourished, and in no distress.  HENT:  Head: Normocephalic and atraumatic.  Eyes: Pupils are equal, round, and reactive to light.  Cardiovascular: Normal rate, regular rhythm, normal heart sounds and intact distal pulses.  Pulmonary/Chest: Effort normal and breath sounds normal.  Musculoskeletal:     Left foot: Normal capillary refill. Swelling and tenderness present. No deformity or crepitus.       Feet:  Neurological: She is alert and oriented to person, place, and time. Gait normal.  Skin: Skin is warm and dry. No rash noted. She is not diaphoretic.    Psychiatric: Mood, memory, affect and judgment normal.  Nursing note and vitals reviewed.   Urgent Care Treatments / Results:   Orders Placed This Encounter  Procedures  . DG Foot Complete Left    LABS: PLEASE NOTE: all labs that were ordered this encounter are listed, however only abnormal results are displayed. Labs Reviewed - No data to display  EKG: -None  RADIOLOGY: DG Foot Complete Left  Result Date: 07/29/2019 CLINICAL DATA:  Left foot pain and swelling mainly centered around the second toe. EXAM: LEFT FOOT - COMPLETE 3+ VIEW COMPARISON:  None. FINDINGS: The joint spaces are maintained. No acute fractures identified. Suspect mild hammertoe deformities mainly at the second and third digits. The mid and hindfoot bony structures are intact. No arthropathic findings. IMPRESSION: No acute bony findings or significant degenerative changes. Suspect hammertoe deformities at the second and third digits. Electronically Signed   By: Rudie Meyer M.D.   On: 07/29/2019 13:51    PROCEDURES: Procedures  MEDICATIONS RECEIVED THIS VISIT: Medications - No data to display  PERTINENT CLINICAL COURSE NOTES/UPDATES:   Initial Impression / Assessment and Plan / Urgent Care Course:  Pertinent labs & imaging results that were available during my care of the patient were personally reviewed by me and considered in my medical decision making (see lab/imaging section of note for values and interpretations).  Teresa Vaughn is a 59 y.o. female who presents to [facility] today with complaints of Toe Pain (2nd, left foot)  Patient is well appearing overall in clinic today. She does not appear to be in any acute distress. Presenting symptoms (see HPI) and exam as documented above.  Pain in LEFT foot persistent x1 month.  Patient denies injury.  Diagnostic plain films of her foot today reveal no acute osseous abnormalities; no fracture or dislocation.  There was mention of hammertoe  deformities of the second and third digits.  Exam reveals pain, swelling, and slight warmth to the foot.  Suspect gout.  Will treat with a 10-day course of systemic steroids.  She was educated on complimentary modalities to help with her pain. Patient encouraged to rest, ice, and elevate her foot. Verbalized understanding that ice shoulder be applied TID-QID for at least 15-20 minutes at a time; written information provided on today's AVS. discussed with patient that if her symptoms are not improving she will need to be seen for further evaluation by podiatry.  Patient will discuss with her PCP should referral be required.  Discussed follow up with primary care physician in 1 week for re-evaluation. I have reviewed the follow up and strict return precautions for any new or worsening symptoms. Patient is aware of symptoms that would be deemed urgent/emergent, and would thus require further evaluation either here or in the  emergency department. At the time of discharge, she verbalized understanding and consent with the discharge plan as it was reviewed with her. All questions were fielded by provider and/or clinic staff prior to patient discharge.    Final Clinical Impressions / Urgent Care Diagnoses:   Final diagnoses:  Foot pain, left  Foot swelling  History of gout    New Prescriptions:  [place] Controlled Substance Registry consulted? Not Applicable  Meds ordered this encounter  Medications  . predniSONE (STERAPRED UNI-PAK 21 TAB) 10 MG (21) TBPK tablet    Sig: Take by mouth daily. 5 tabs for 2 days, then 4 tabs for 2 days, then 3 tabs for 2 days, 2 tabs for 2 days, then 1 tab by mouth daily for 2 days    Dispense:  30 tablet    Refill:  0    Recommended Follow up Care:  Patient encouraged to follow up with the following provider within the specified time frame, or sooner as dictated by the severity of her symptoms. As always, she was instructed that for any urgent/emergent care needs, she  should seek care either here or in the emergency department for more immediate evaluation.  Follow-up Information    Tracie Harrier, MD In 1 week.   Specialty: Internal Medicine Why: General reassessment of symptoms if not improving Contact information: [place] [address] [phone]         NOTE: This note was prepared using Dragon dictation software along with smaller phrase technology. Despite my best ability to proofread, there is the potential that transcriptional errors may still occur from this process, and are completely unintentional.    Karen Kitchens, NP 07/30/19 1640

## 2019-10-29 ENCOUNTER — Encounter: Payer: Self-pay | Admitting: Emergency Medicine

## 2019-10-29 ENCOUNTER — Emergency Department
Admission: EM | Admit: 2019-10-29 | Discharge: 2019-10-29 | Disposition: A | Discharge status: Home / Self Care | Payer: BC Managed Care – PPO | Attending: Emergency Medicine | Admitting: Emergency Medicine

## 2019-10-29 ENCOUNTER — Other Ambulatory Visit: Payer: Self-pay

## 2019-10-29 ENCOUNTER — Emergency Department: Payer: BC Managed Care – PPO

## 2019-10-29 DIAGNOSIS — W228XXA Striking against or struck by other objects, initial encounter: Secondary | ICD-10-CM | POA: Diagnosis not present

## 2019-10-29 DIAGNOSIS — R52 Pain, unspecified: Secondary | ICD-10-CM

## 2019-10-29 DIAGNOSIS — F17210 Nicotine dependence, cigarettes, uncomplicated: Secondary | ICD-10-CM | POA: Diagnosis not present

## 2019-10-29 DIAGNOSIS — Z79899 Other long term (current) drug therapy: Secondary | ICD-10-CM | POA: Diagnosis not present

## 2019-10-29 DIAGNOSIS — S92501A Displaced unspecified fracture of right lesser toe(s), initial encounter for closed fracture: Secondary | ICD-10-CM

## 2019-10-29 DIAGNOSIS — S92511A Displaced fracture of proximal phalanx of right lesser toe(s), initial encounter for closed fracture: Secondary | ICD-10-CM | POA: Insufficient documentation

## 2019-10-29 DIAGNOSIS — Y999 Unspecified external cause status: Secondary | ICD-10-CM | POA: Insufficient documentation

## 2019-10-29 DIAGNOSIS — Y929 Unspecified place or not applicable: Secondary | ICD-10-CM | POA: Insufficient documentation

## 2019-10-29 DIAGNOSIS — I10 Essential (primary) hypertension: Secondary | ICD-10-CM | POA: Diagnosis not present

## 2019-10-29 DIAGNOSIS — S99921A Unspecified injury of right foot, initial encounter: Secondary | ICD-10-CM | POA: Diagnosis present

## 2019-10-29 DIAGNOSIS — Y939 Activity, unspecified: Secondary | ICD-10-CM | POA: Insufficient documentation

## 2019-10-29 MED ORDER — IBUPROFEN 600 MG PO TABS
600.0000 mg | ORAL_TABLET | Freq: Once | ORAL | Status: AC
  Administered 2019-10-29: 600 mg via ORAL
  Filled 2019-10-29: qty 1

## 2019-10-29 MED ORDER — OXYCODONE-ACETAMINOPHEN 5-325 MG PO TABS
1.0000 | ORAL_TABLET | Freq: Once | ORAL | Status: AC
  Administered 2019-10-29: 1 via ORAL
  Filled 2019-10-29: qty 1

## 2019-10-29 MED ORDER — OXYCODONE-ACETAMINOPHEN 7.5-325 MG PO TABS: 1.0000 | ORAL_TABLET | Freq: Four times a day (QID) | ORAL | 0 refills | Status: DC | PRN

## 2019-10-29 MED ORDER — IBUPROFEN 600 MG PO TABS: 600.0000 mg | ORAL_TABLET | Freq: Three times a day (TID) | ORAL | 0 refills | Status: DC | PRN

## 2019-10-29 NOTE — ED Provider Notes (Signed)
[facility] Emergency Department Provider Note   ____________________________________________   First MD Initiated Contact with Patient 10/29/19 1235     (approximate)  I have reviewed the triage vital signs and the nursing notes.   HISTORY  Chief Complaint Toe Pain    HPI Teresa Vaughn is a 59 y.o. female patient complain of pain to the fourth and fifth digit right foot secondary to stubbing incident last night.  Patient states she went to work today and due to the prolonged standing the pain is worsened.  Patient also has been diagnosed with plantar fasciitis and has been followed by podiatry.  Patient rates her pain today as is 8/10.  Patient scribed pain is "achy".  Palliative measure for complaint.         Past Medical History:  Diagnosis Date  . Hypercholesteremia   . Hypertension   . PONV (postoperative nausea and vomiting)     There are no problems to display for this patient.   Past Surgical History:  Procedure Laterality Date  . ABDOMINAL HYSTERECTOMY    . CHOLECYSTECTOMY    . COLONOSCOPY WITH PROPOFOL N/A 05/26/2019   Procedure: COLONOSCOPY WITH PROPOFOL;  Surgeon: Benjamine Sprague, DO;  Location: [facility] ENDOSCOPY;  Service: General;  Laterality: N/A;  . TONSILLECTOMY      Prior to Admission medications   Medication Sig Start Date End Date Taking? Authorizing Provider  amLODipine (NORVASC) 5 MG tablet Take 5 mg by mouth daily.  08/24/17 07/29/19  [provider]  atorvastatin (LIPITOR) 40 MG tablet Take 40 mg by mouth daily.    [provider]  fenofibrate (TRICOR) 48 MG tablet Take 1 tablet by mouth daily. 12/03/18   [provider]  ibuprofen (ADVIL) 600 MG tablet Take 1 tablet (600 mg total) by mouth every 8 (eight) hours as needed. 10/29/19   Sable Feil, PA-C  oxyCODONE-acetaminophen (PERCOCET) 7.5-325 MG tablet Take 1 tablet by mouth every 6 (six) hours as needed. 10/29/19   Sable Feil, PA-C    predniSONE (STERAPRED UNI-PAK 21 TAB) 10 MG (21) TBPK tablet Take by mouth daily. 5 tabs for 2 days, then 4 tabs for 2 days, then 3 tabs for 2 days, 2 tabs for 2 days, then 1 tab by mouth daily for 2 days 07/29/19   Karen Kitchens, NP  vitamin B-12 (CYANOCOBALAMIN) 1000 MCG tablet Take 1,000 mcg by mouth daily.     [provider]  losartan-hydrochlorothiazide (HYZAAR) 100-25 MG tablet TAKE 1 TABLET BY MOUTH ONCE DAILY. 10/22/17 07/29/19  [provider]    Allergies Lisinopril and Penicillins  Family History  Problem Relation Age of Onset  . Breast cancer Paternal Aunt 59  . Hypertension Mother   . Liver cancer Mother   . Hypertension Father   . Bone cancer Father     Social History Social History   Tobacco Use  . Smoking status: Current Every Day Smoker    Packs/day: 0.50    Types: Cigarettes    Start date: 07/07/1989  . Smokeless tobacco: Never Used  Substance Use Topics  . Alcohol use: Yes    Alcohol/week: 7.0 standard drinks    Types: 7 Standard drinks or equivalent per week  . Drug use: Never    Review of Systems Constitutional: No fever/chills Eyes: No visual changes. ENT: No sore throat. Cardiovascular: Denies chest pain. Respiratory: Denies shortness of breath. Gastrointestinal: No abdominal pain.  No nausea, no vomiting.  No diarrhea.  No constipation. Genitourinary: Negative for dysuria. Musculoskeletal: Right fifth toe pain Skin: Negative for rash. Neurological: Negative for headaches, focal weakness or numbness. Endocrine:  Hypertension Allergic/Immunilogical: Lisinopril and penicillin ____________________________________________   PHYSICAL EXAM:  VITAL SIGNS: ED Triage Vitals  Enc Vitals Group     BP 10/29/19 1224 (!) 174/86     Pulse Rate 10/29/19 1224 96     Resp 10/29/19 1224 16     Temp 10/29/19 1224 98.4 F (36.9 C)     Temp Source 10/29/19 1224 Oral     SpO2 10/29/19 1224 98 %     Weight 10/29/19 1224 162 lb (73.5 kg)      Height 10/29/19 1224 5\' 3"  (1.6 m)     Head Circumference --      Peak Flow --      Pain Score 10/29/19 1230 8     Pain Loc --      Pain Edu? --      Excl. in GC? --     Constitutional: Alert and oriented. Well appearing and in no acute distress. Cardiovascular: Normal rate, regular rhythm. Grossly normal heart sounds.  Good peripheral circulation.  Elevated blood pressure Respiratory: Normal respiratory effort.  No retractions. Lungs CTAB. Musculoskeletal: No lower extremity tenderness nor edema.  No joint effusions. Neurologic:  Normal speech and language. No gross focal neurologic deficits are appreciated. No gait instability. Skin:  Skin is warm, dry and intact. No rash noted. Psychiatric: Mood and affect are normal. Speech and behavior are normal.  ____________________________________________   LABS (all labs ordered are listed, but only abnormal results are displayed)  Labs Reviewed - No data to display ____________________________________________  EKG   ____________________________________________  RADIOLOGY  ED MD interpretation:    Official radiology report(s): DG Foot Complete Right  Result Date: 10/29/2019 CLINICAL DATA:  Stubbed fifth toe yesterday with persistent pain, initial encounter EXAM: RIGHT FOOT COMPLETE - 3+ VIEW COMPARISON:  None. FINDINGS: There is a fracture in the fifth proximal phalanx distally. No other fracture is identified. No significant soft tissue abnormality is seen. IMPRESSION: Fracture of the fifth proximal phalanx. Electronically Signed   By: 10/31/2019 M.D.   On: 10/29/2019 13:31    ____________________________________________   PROCEDURES  Procedure(s) performed (including Critical Care):  Procedures   ____________________________________________   INITIAL IMPRESSION / ASSESSMENT AND PLAN / ED COURSE  As part of my medical decision making, I reviewed the following data within the electronic MEDICAL RECORD NUMBER     Patient  presents with right toe pain secondary to stubbing incident.  X-ray showed a nondisplaced fracture of the fifth toe.  Discussed x-ray findings with patient.  Patient toe was buddy taped and placed in a postop shoe.  Patient given crutches to assist with ambulation.  Patient given discharge care instruction advised take medication as directed.  Patient vies follow-up podiatry.    Teresa Vaughn was evaluated in Emergency Department on 10/29/2019 for the symptoms described in the history of present illness. She was evaluated in the context of the global COVID-19 pandemic, which necessitated consideration that the patient might be at risk for infection with the SARS-CoV-2 virus that causes COVID-19. Institutional protocols and algorithms that pertain to the evaluation of patients at risk for COVID-19 are in a state of rapid change based on information released by regulatory bodies including the CDC and federal and state organizations. These policies and algorithms were followed during the patient's care in the ED.  ____________________________________________   FINAL CLINICAL IMPRESSION(S) / ED DIAGNOSES  Final diagnoses:  Fracture of fifth toe, right, closed, initial encounter     ED Discharge Orders         Ordered    ibuprofen (ADVIL) 600 MG tablet  Every 8 hours PRN     10/29/19 1347    oxyCODONE-acetaminophen (PERCOCET) 7.5-325 MG tablet  Every 6 hours PRN     10/29/19 1347           Note:  This document was prepared using Dragon voice recognition software and may include unintentional dictation errors.    Joni Reining, PA-C 10/29/19 1350    Concha Se, MD 10/30/19 [phone]

## 2019-10-29 NOTE — Discharge Instructions (Signed)
Follow discharge care instruction take medication as directed. °

## 2019-10-29 NOTE — ED Triage Notes (Signed)
Pt states injured R pinky toe yesterday morning at approx 0400. Ambulatory to triage at this time.

## 2020-09-11 ENCOUNTER — Other Ambulatory Visit: Payer: Self-pay | Admitting: Internal Medicine

## 2020-09-11 DIAGNOSIS — Z1231 Encounter for screening mammogram for malignant neoplasm of breast: Secondary | ICD-10-CM

## 2020-09-21 ENCOUNTER — Emergency Department: Payer: BC Managed Care – PPO

## 2020-09-21 ENCOUNTER — Emergency Department
Admission: EM | Admit: 2020-09-21 | Discharge: 2020-09-21 | Disposition: A | Discharge status: Home / Self Care | Payer: BC Managed Care – PPO | Attending: Student in an Organized Health Care Education/Training Program | Admitting: Student in an Organized Health Care Education/Training Program

## 2020-09-21 ENCOUNTER — Other Ambulatory Visit: Payer: Self-pay

## 2020-09-21 DIAGNOSIS — M25552 Pain in left hip: Secondary | ICD-10-CM | POA: Diagnosis present

## 2020-09-21 DIAGNOSIS — I10 Essential (primary) hypertension: Secondary | ICD-10-CM | POA: Insufficient documentation

## 2020-09-21 DIAGNOSIS — M1612 Unilateral primary osteoarthritis, left hip: Secondary | ICD-10-CM | POA: Diagnosis not present

## 2020-09-21 DIAGNOSIS — F17210 Nicotine dependence, cigarettes, uncomplicated: Secondary | ICD-10-CM | POA: Diagnosis not present

## 2020-09-21 DIAGNOSIS — R1032 Left lower quadrant pain: Secondary | ICD-10-CM

## 2020-09-21 MED ORDER — LIDOCAINE 5 % EX PTCH
1.0000 | MEDICATED_PATCH | CUTANEOUS | Status: DC
  Administered 2020-09-21: 1 via TRANSDERMAL
  Filled 2020-09-21: qty 1

## 2020-09-21 MED ORDER — TRAMADOL HCL 50 MG PO TABS: 50.0000 mg | ORAL_TABLET | Freq: Four times a day (QID) | ORAL | 0 refills | Status: DC | PRN

## 2020-09-21 MED ORDER — MELOXICAM 15 MG PO TABS: 15.0000 mg | ORAL_TABLET | Freq: Every day | ORAL | 0 refills | Status: DC

## 2020-09-21 NOTE — ED Triage Notes (Signed)
Pt states that her left hip has been hurting x2 days- pt denies any injury to hip- pt able to ambulate to triage room

## 2020-09-21 NOTE — ED Provider Notes (Signed)
[facility] Emergency Department Provider Note   ____________________________________________   Event Date/Time   First MD Initiated Contact with Patient 09/21/20 [phone]     (approximate)  I have reviewed the triage vital signs and the nursing notes.   HISTORY  Chief Complaint Hip Pain    HPI Teresa Vaughn is a 60 y.o. female patient complaint of 4 days of left hip/inguinal pain.  Patient said no provocative incident for complaint.  Patient pain has increased but she is still able to ambulate.  Rates pain as 10/10.  Described pain as "achy".  No palliative measure for complaint.         Past Medical History:  Diagnosis Date  . Hypercholesteremia   . Hypertension   . PONV (postoperative nausea and vomiting)     There are no problems to display for this patient.   Past Surgical History:  Procedure Laterality Date  . ABDOMINAL HYSTERECTOMY    . CHOLECYSTECTOMY    . COLONOSCOPY WITH PROPOFOL N/A 05/26/2019   Procedure: COLONOSCOPY WITH PROPOFOL;  Surgeon: Sung Amabile, DO;  Location: [facility] ENDOSCOPY;  Service: General;  Laterality: N/A;  . TONSILLECTOMY      Prior to Admission medications   Medication Sig Start Date End Date Taking? Authorizing Provider  meloxicam (MOBIC) 15 MG tablet Take 1 tablet (15 mg total) by mouth daily. 09/21/20  Yes Joni Reining, PA-C  traMADol (ULTRAM) 50 MG tablet Take 1 tablet (50 mg total) by mouth every 6 (six) hours as needed for moderate pain. 09/21/20  Yes Joni Reining, PA-C  amLODipine (NORVASC) 5 MG tablet Take 5 mg by mouth daily.  08/24/17 07/29/19  [provider]  atorvastatin (LIPITOR) 40 MG tablet Take 40 mg by mouth daily.    [provider]  fenofibrate (TRICOR) 48 MG tablet Take 1 tablet by mouth daily. 12/03/18   [provider]  ibuprofen (ADVIL) 600 MG tablet Take 1 tablet (600 mg total) by mouth every 8 (eight) hours as needed. 10/29/19   Joni Reining, PA-C   vitamin B-12 (CYANOCOBALAMIN) 1000 MCG tablet Take 1,000 mcg by mouth daily.     [provider]  losartan-hydrochlorothiazide (HYZAAR) 100-25 MG tablet TAKE 1 TABLET BY MOUTH ONCE DAILY. 10/22/17 07/29/19  [provider]    Allergies Lisinopril and Penicillins  Family History  Problem Relation Age of Onset  . Breast cancer Paternal Aunt 49  . Hypertension Mother   . Liver cancer Mother   . Hypertension Father   . Bone cancer Father     Social History Social History   Tobacco Use  . Smoking status: Current Every Day Smoker    Packs/day: 0.50    Types: Cigarettes    Start date: 07/07/1989  . Smokeless tobacco: Never Used  Vaping Use  . Vaping Use: Never used  Substance Use Topics  . Alcohol use: Yes    Alcohol/week: 7.0 standard drinks    Types: 7 Standard drinks or equivalent per week  . Drug use: Never    Review of Systems Constitutional: No fever/chills Eyes: No visual changes. ENT: No sore throat. Cardiovascular: Denies chest pain. Respiratory: Denies shortness of breath. Gastrointestinal: No abdominal pain.  No nausea, no vomiting.  No diarrhea.  No constipation. Genitourinary: Negative for dysuria. Musculoskeletal: Negative for back pain. Skin: Negative for rash. Neurological: Negative for headaches, focal weakness or numbness. Endocrine:  Hyperlipidemia and hypertension. Allergic/Immunilogical: Lisinopril and penicillin. ____________________________________________   PHYSICAL EXAM:  VITAL  SIGNS: ED Triage Vitals  Enc Vitals Group     BP 09/21/20 0933 (!) 160/90     Pulse Rate 09/21/20 0933 86     Resp 09/21/20 0933 16     Temp 09/21/20 0933 98.2 F (36.8 C)     Temp Source 09/21/20 0933 Oral     SpO2 09/21/20 0933 98 %     Weight 09/21/20 0933 173 lb (78.5 kg)     Height 09/21/20 0933 5\' 3"  (1.6 m)     Head Circumference --      Peak Flow --      Pain Score 09/21/20 0932 10     Pain Loc --      Pain Edu? --      Excl. in GC?  --    Constitutional: Alert and oriented. Well appearing and in no acute distress. Eyes: Conjunctivae are normal. PERRL. EOMI. Head: Atraumatic. Nose: No congestion/rhinnorhea. Mouth/Throat: Mucous membranes are moist.  Oropharynx non-erythematous. Neck: No stridor.  No cervical spine tenderness to palpation. Hematological/Lymphatic/Immunilogical: No cervical lymphadenopathy. Cardiovascular: Normal rate, regular rhythm. Grossly normal heart sounds.  Good peripheral circulation. Respiratory: Normal respiratory effort.  No retractions. Lungs CTAB. Gastrointestinal: Soft and nontender. No distention. No abdominal bruits. No CVA tenderness. Genitourinary: Deferred Musculoskeletal: No obvious left hip deformity.  No leg length discrepancy.  Patient has moderate guarding palpation of greater trochanter moderate guarding palpation of the left inguinal area.  Patient has full Nikkel range of motion of the hip and lower extremity.  No joint effusions. Neurologic:  Normal speech and language. No gross focal neurologic deficits are appreciated. No gait instability. Skin:  Skin is warm, dry and intact. No rash noted. Psychiatric: Mood and affect are normal. Speech and behavior are normal.  ____________________________________________   LABS (all labs ordered are listed, but only abnormal results are displayed)  Labs Reviewed - No data to display ____________________________________________  EKG   ____________________________________________  RADIOLOGY I, 11/21/20, personally viewed and evaluated these images (plain radiographs) as part of my medical decision making, as well as reviewing the written report by the radiologist.  ED MD interpretation: No acute findings on x-ray of the left hip.  Official radiology report(s): Joni Reining LT LOWER EXTREM LTD SOFT TISSUE NON VASCULAR  Result Date: 09/21/2020 CLINICAL DATA:  Pain left inguinal region EXAM: ULTRASOUND LEFT INGUINAL REGION TECHNIQUE:  Ultrasound examination of the left inguinal region was performed with and without Valsalva maneuver. COMPARISON:  None. FINDINGS: There is no demonstrable hernia in the left inguinal region. No mass or adenopathy. No abnormal fluid or inflammatory change. IMPRESSION: No abnormality noted by ultrasound in the left inguinal region. Electronically Signed   By: 11/21/2020 III M.D.   On: 09/21/2020 10:56   DG Hip Unilat W or Wo Pelvis 2-3 Views Left  Result Date: 09/21/2020 CLINICAL DATA:  Pain EXAM: DG HIP (WITH OR WITHOUT PELVIS) 2-3V LEFT COMPARISON:  None. FINDINGS: Frontal pelvis as well as frontal and lateral left hip images were obtained. No fracture or dislocation. Joint spaces appear normal. There is mild degenerative change in the sacroiliac joints. There is degenerative change in the pubic symphysis. Tubal ligation clips present. IMPRESSION: No fracture or dislocation. Normal appearing hip joints. Degenerative type change noted in pubic symphysis and sacroiliac joints. Electronically Signed   By: 11/21/2020 III M.D.   On: 09/21/2020 11:26    ____________________________________________   PROCEDURES  Procedure(s) performed (including Critical Care):  Procedures   ____________________________________________  INITIAL IMPRESSION / ASSESSMENT AND PLAN / ED COURSE  As part of my medical decision making, I reviewed the following data within the electronic MEDICAL RECORD NUMBER         Patient presents with approximate 4 days of nontraumatic left hip/inguinal pain.  Discussed no acute findings on ultrasound of the left inguinal area.  Discussed degenerative changes found in the pubic symphysis sacroiliac joint.  Patient complaint physical exam consistent with left hip pain secondary to arthritis.  Patient given discharge care instruction advised take medication as directed.  Patient advised to follow-up with PCP.     ____________________________________________   FINAL  CLINICAL IMPRESSION(S) / ED DIAGNOSES  Final diagnoses:  Left hip pain  Arthritis of left hip     ED Discharge Orders         Ordered    meloxicam (MOBIC) 15 MG tablet  Daily        09/21/20 1146    traMADol (ULTRAM) 50 MG tablet  Every 6 hours PRN        09/21/20 1146          *Please note:  Teresa Vaughn was evaluated in Emergency Department on 09/21/2020 for the symptoms described in the history of present illness. She was evaluated in the context of the global COVID-19 pandemic, which necessitated consideration that the patient might be at risk for infection with the SARS-CoV-2 virus that causes COVID-19. Institutional protocols and algorithms that pertain to the evaluation of patients at risk for COVID-19 are in a state of rapid change based on information released by regulatory bodies including the CDC and federal and state organizations. These policies and algorithms were followed during the patient's care in the ED.  Some ED evaluations and interventions may be delayed as a result of limited staffing during and the pandemic.*   Note:  This document was prepared using Dragon voice recognition software and may include unintentional dictation errors.    Joni Reining, PA-C 09/21/20 1149    Willy Eddy, MD 09/21/20 1259

## 2020-09-21 NOTE — ED Notes (Signed)
See triage note  Presents with pain to left lower back/hip area for couple of days  States pain is moving into left leg  Ambulates well to treatment room

## 2020-09-21 NOTE — Discharge Instructions (Addendum)
No acute findings on ultrasound the left inguinal area.  X-ray showed degenerative changes in the pubic symphysis and sacroiliac joint.  Wear Lidoderm patch for 12 hours.  Take medication as directed.  Follow-up with PCP for continued care.

## 2020-09-23 ENCOUNTER — Emergency Department: Payer: BC Managed Care – PPO

## 2020-09-23 ENCOUNTER — Other Ambulatory Visit: Payer: Self-pay

## 2020-09-23 ENCOUNTER — Emergency Department
Admission: EM | Admit: 2020-09-23 | Discharge: 2020-09-23 | Disposition: A | Discharge status: Home / Self Care | Payer: BC Managed Care – PPO | Attending: Emergency Medicine | Admitting: Emergency Medicine

## 2020-09-23 DIAGNOSIS — M25552 Pain in left hip: Secondary | ICD-10-CM | POA: Diagnosis not present

## 2020-09-23 DIAGNOSIS — X501XXA Overexertion from prolonged static or awkward postures, initial encounter: Secondary | ICD-10-CM | POA: Insufficient documentation

## 2020-09-23 DIAGNOSIS — Y93E5 Activity, floor mopping and cleaning: Secondary | ICD-10-CM | POA: Insufficient documentation

## 2020-09-23 DIAGNOSIS — I10 Essential (primary) hypertension: Secondary | ICD-10-CM | POA: Diagnosis not present

## 2020-09-23 DIAGNOSIS — Z79899 Other long term (current) drug therapy: Secondary | ICD-10-CM | POA: Insufficient documentation

## 2020-09-23 DIAGNOSIS — F17210 Nicotine dependence, cigarettes, uncomplicated: Secondary | ICD-10-CM | POA: Diagnosis not present

## 2020-09-23 DIAGNOSIS — Y92002 Bathroom of unspecified non-institutional (private) residence single-family (private) house as the place of occurrence of the external cause: Secondary | ICD-10-CM | POA: Diagnosis not present

## 2020-09-23 MED ORDER — OXYCODONE HCL 5 MG PO TABS: 5.0000 mg | ORAL_TABLET | Freq: Once | ORAL | Status: DC

## 2020-09-23 MED ORDER — ACETAMINOPHEN 325 MG PO TABS: 650.0000 mg | ORAL_TABLET | Freq: Once | ORAL | Status: DC

## 2020-09-23 MED ORDER — KETOROLAC TROMETHAMINE 30 MG/ML IJ SOLN: 30.0000 mg | Freq: Once | INTRAMUSCULAR | Status: DC

## 2020-09-23 MED ORDER — HYDROMORPHONE HCL 1 MG/ML IJ SOLN
0.5000 mg | Freq: Once | INTRAMUSCULAR | Status: AC
  Administered 2020-09-23: 0.5 mg via INTRAVENOUS
  Filled 2020-09-23: qty 1

## 2020-09-23 MED ORDER — KETOROLAC TROMETHAMINE 30 MG/ML IJ SOLN
15.0000 mg | Freq: Once | INTRAMUSCULAR | Status: AC
  Administered 2020-09-23: 15 mg via INTRAVENOUS
  Filled 2020-09-23: qty 1

## 2020-09-23 MED ORDER — ONDANSETRON HCL 4 MG/2ML IJ SOLN
4.0000 mg | Freq: Once | INTRAMUSCULAR | Status: AC
  Administered 2020-09-23: 4 mg via INTRAVENOUS
  Filled 2020-09-23: qty 2

## 2020-09-23 MED ORDER — OXYCODONE HCL 5 MG PO TABS: 5.0000 mg | ORAL_TABLET | Freq: Four times a day (QID) | ORAL | 0 refills | Status: AC | PRN

## 2020-09-23 MED ORDER — OXYCODONE HCL 5 MG PO TABS
5.0000 mg | ORAL_TABLET | Freq: Once | ORAL | Status: AC
  Administered 2020-09-23: 5 mg via ORAL
  Filled 2020-09-23: qty 1

## 2020-09-23 NOTE — ED Notes (Signed)
E signature pad in hall not working. Pt educated on discharge instructions and verbalized understanding.

## 2020-09-23 NOTE — Discharge Instructions (Addendum)
Your x-ray was negative for fractures and your ultrasound was negative for blood clot.  This seems to be more likely related to the muscle or perhaps some bone-on-bone irritation.  Stop taking the tramadol and take the oxycodone that is stronger.  Take Tylenol 1 g every 8 hours as well as the meloxicam.  Call the orthopedic number above to schedule a follow-up appointment.  Return to the ER if you develop worsening symptoms or any other concerns.  In the meantime use the walker to help get around  Take oxycodone as prescribed. Do not drink alcohol, drive or participate in any other potentially dangerous activities while taking this medication as it may make you sleepy. Do not take this medication with any other sedating medications, either prescription or over-the-counter. If you were prescribed Percocet or Vicodin, do not take these with acetaminophen (Tylenol) as it is already contained within these medications.  This medication is an opiate (or narcotic) pain medication and can be habit forming. Use it as little as possible to achieve adequate pain control. Do not use or use it with extreme caution if you have a history of opiate abuse or dependence. If you are on a pain contract with your primary care doctor or a pain specialist, be sure to let them know you were prescribed this medication today from the [facility] Emergency Department. This medication is intended for your use only - do not give any to anyone else and keep it in a secure place where nobody else, especially children, have access to it.

## 2020-09-23 NOTE — ED Provider Notes (Signed)
[facility] Emergency Department Provider Note  ____________________________________________   Event Date/Time   First MD Initiated Contact with Patient 09/23/20 0222     (approximate)  I have reviewed the triage vital signs and the nursing notes.   HISTORY  Chief Complaint Hip Pain (Left )    HPI Teresa Vaughn is a 60 y.o. female with hypertension who comes in with hip pain.  Patient was seen on 4/8 on review of records and had x-ray that did show  signs of degenerative changes.  Patient was discharged on tramadol and meloxicam.  Patient states that she has been taking that medication without any improvement in symptoms.  She states that today she was cleaning the bathroom when she heard something pop and then immediately began to have more pain.  The pain is severe, constant, over the left hip, worse with movement, better at rest.  She denies any falls in the past week.  No back pain.  She denies any numbness or tingling of the leg or rectum area.  Denies any urinary symptoms, abdominal pain. Denies urinary incontinence.           Past Medical History:  Diagnosis Date  . Hypercholesteremia   . Hypertension   . PONV (postoperative nausea and vomiting)     There are no problems to display for this patient.   Past Surgical History:  Procedure Laterality Date  . ABDOMINAL HYSTERECTOMY    . CHOLECYSTECTOMY    . COLONOSCOPY WITH PROPOFOL N/A 05/26/2019   Procedure: COLONOSCOPY WITH PROPOFOL;  Surgeon: Sung Amabile, DO;  Location: [facility] ENDOSCOPY;  Service: General;  Laterality: N/A;  . TONSILLECTOMY      Prior to Admission medications   Medication Sig Start Date End Date Taking? Authorizing Provider  amLODipine (NORVASC) 5 MG tablet Take 5 mg by mouth daily.  08/24/17 07/29/19  [provider]  atorvastatin (LIPITOR) 40 MG tablet Take 40 mg by mouth daily.    [provider]  fenofibrate (TRICOR) 48 MG tablet Take 1 tablet by  mouth daily. 12/03/18   [provider]  ibuprofen (ADVIL) 600 MG tablet Take 1 tablet (600 mg total) by mouth every 8 (eight) hours as needed. 10/29/19   Joni Reining, PA-C  meloxicam (MOBIC) 15 MG tablet Take 1 tablet (15 mg total) by mouth daily. 09/21/20   Joni Reining, PA-C  traMADol (ULTRAM) 50 MG tablet Take 1 tablet (50 mg total) by mouth every 6 (six) hours as needed for moderate pain. 09/21/20   Joni Reining, PA-C  vitamin B-12 (CYANOCOBALAMIN) 1000 MCG tablet Take 1,000 mcg by mouth daily.     [provider]  losartan-hydrochlorothiazide (HYZAAR) 100-25 MG tablet TAKE 1 TABLET BY MOUTH ONCE DAILY. 10/22/17 07/29/19  [provider]    Allergies Lisinopril and Penicillins  Family History  Problem Relation Age of Onset  . Breast cancer Paternal Aunt 72  . Hypertension Mother   . Liver cancer Mother   . Hypertension Father   . Bone cancer Father     Social History Social History   Tobacco Use  . Smoking status: Current Every Day Smoker    Packs/day: 0.50    Types: Cigarettes    Start date: 07/07/1989  . Smokeless tobacco: Never Used  Vaping Use  . Vaping Use: Never used  Substance Use Topics  . Alcohol use: Yes    Alcohol/week: 7.0 standard drinks    Types: 7 Standard drinks or equivalent  per week  . Drug use: Never      Review of Systems Constitutional: No fever/chills Eyes: No visual changes. ENT: No sore throat. Cardiovascular: Denies chest pain. Respiratory: Denies shortness of breath. Gastrointestinal: No abdominal pain.  No nausea, no vomiting.  No diarrhea.  No constipation. Genitourinary: Negative for dysuria. Musculoskeletal: Left hip pain Skin: Negative for rash. Neurological: Negative for headaches, focal weakness or numbness. All other ROS negative ____________________________________________   PHYSICAL EXAM:  VITAL SIGNS: ED Triage Vitals  Enc Vitals Group     BP 09/23/20 0134 (!) 177/92     Pulse Rate  09/23/20 0134 85     Resp 09/23/20 0134 18     Temp 09/23/20 0134 98.2 F (36.8 C)     Temp Source 09/23/20 0134 Oral     SpO2 09/23/20 0134 98 %     Weight 09/23/20 0135 173 lb (78.5 kg)     Height 09/23/20 0135 5\' 3"  (1.6 m)     Head Circumference --      Peak Flow --      Pain Score 09/23/20 0135 10     Pain Loc --      Pain Edu? --      Excl. in GC? --     Constitutional: Alert and oriented. Well appearing and in no acute distress. Eyes: Conjunctivae are normal. EOMI. Head: Atraumatic. Nose: No congestion/rhinnorhea. Mouth/Throat: Mucous membranes are moist.   Neck: No stridor. Trachea Midline. FROM Cardiovascular: Normal rate, regular rhythm. Grossly normal heart sounds.  Good peripheral circulation. Respiratory: Normal respiratory effort.  No retractions. Lungs CTAB. Gastrointestinal: Soft and nontender. No distention. No abdominal bruits.  Musculoskeletal: Patient has pain over the left hip with palpation.  Patient having difficulty lifting up the left leg secondary to pain.  Sensation intact bilaterally.  Good distal pulse.  No redness or erythema noted over the hip joint. Neurologic:  Normal speech and language. No gross focal neurologic deficits are appreciated.  Skin:  Skin is warm, dry and intact. No rash noted. Psychiatric: Mood and affect are normal. Speech and behavior are normal. GU: Deferred  Back: No CTL spine tenderness ____________________________________________    RADIOLOGY 11/23/20, personally viewed and evaluated these images (plain radiographs) as part of my medical decision making, as well as reviewing the written report by the radiologist.  ED MD interpretation: No fracture  Official radiology report(s): Vela Prose Venous Img Lower Unilateral Left  Result Date: 09/23/2020 CLINICAL DATA:  Left hip pain EXAM: LEFT LOWER EXTREMITY VENOUS DOPPLER ULTRASOUND TECHNIQUE: Gray-scale sonography with compression, as well as color and duplex ultrasound, were  performed to evaluate the deep venous system(s) from the level of the common femoral vein through the popliteal and proximal calf veins. COMPARISON:  None. FINDINGS: VENOUS Normal compressibility of the common femoral, superficial femoral, and popliteal veins, as well as the visualized calf veins. Visualized portions of profunda femoral vein and great saphenous vein unremarkable. No filling defects to suggest DVT on grayscale or color Doppler imaging. Doppler waveforms show normal direction of venous flow, normal respiratory plasticity and response to augmentation. Limited views of the contralateral common femoral vein are unremarkable. IMPRESSION: Negative for DVT in the left lower extremity. Electronically Signed   By: 11/23/2020 M.D.   On: 09/23/2020 04:43   DG Hip Unilat W or Wo Pelvis 2-3 Views Left  Result Date: 09/23/2020 CLINICAL DATA:  Left hip pain, sudden onset and associated with pop EXAM: DG HIP (WITH OR  WITHOUT PELVIS) 2-3V LEFT COMPARISON:  Abdomen and pelvis CT October 2020 FINDINGS: There is no evidence of hip fracture or dislocation. Osteitis pubis and notable L4-5 facet degeneration. IMPRESSION: No acute finding. Electronically Signed   By: Marnee Spring M.D.   On: 09/23/2020 04:22    ____________________________________________   PROCEDURES  Procedure(s) performed (including Critical Care):  Procedures   ____________________________________________   INITIAL IMPRESSION / ASSESSMENT AND PLAN / ED COURSE  ABREE ROMICK was evaluated in Emergency Department on 09/23/2020 for the symptoms described in the history of present illness. She was evaluated in the context of the global COVID-19 pandemic, which necessitated consideration that the patient might be at risk for infection with the SARS-CoV-2 virus that causes COVID-19. Institutional protocols and algorithms that pertain to the evaluation of patients at risk for COVID-19 are in a state of rapid change based on  information released by regulatory bodies including the CDC and federal and state organizations. These policies and algorithms were followed during the patient's care in the ED.    Patient is a 60 year old who comes in with left hip pain.  Patient already had x-ray showing degenerative changes but given she felt a pop today will get repeat imaging to make sure no evidence of dislocation.  Low suspicion for fracture given no trauma.  Will get ultrasound DVT to rule out blood clot.  She is good distal pulse unlikely arterial issue.  Seems to be more musculoskeletal.  There is no back pain.  And denies any symptoms to suggest cord compression. no pain in the abdomen to suggest other causes of pain  Patient unable to get x-ray initially from triage so patient was given IV Dilaudid and IV Zofran to facilitate imaging.  Patient's x-ray is negative for acute change and DVT ultrasound was negative.  Patient was able to ambulate slowly with a walker.  We discussed the utility of CT imaging but at this time given patient can bear weight on it and she denies any trauma I think there will be low suspicion for occult fracture.  I suspect this is more likely degenerative changes and will give her orthopedic follow-up.  We will give her a few oxycodone encouraged her not to take the tramadol.  On my repeat of evaluation her abdomen still remained soft and nontender and low suspicion for abdominal process.         ____________________________________________   FINAL CLINICAL IMPRESSION(S) / ED DIAGNOSES   Final diagnoses:  Left hip pain      MEDICATIONS GIVEN DURING THIS VISIT:  Medications  ketorolac (TORADOL) 30 MG/ML injection 15 mg (has no administration in time range)  oxyCODONE (Oxy IR/ROXICODONE) immediate release tablet 5 mg (has no administration in time range)  HYDROmorphone (DILAUDID) injection 0.5 mg (0.5 mg Intravenous Given 09/23/20 0329)  ondansetron (ZOFRAN) injection 4 mg (4 mg  Intravenous Given 09/23/20 0329)     ED Discharge Orders         Ordered    oxyCODONE (ROXICODONE) 5 MG immediate release tablet  Every 6 hours PRN        09/23/20 0604           Note:  This document was prepared using Dragon voice recognition software and may include unintentional dictation errors.   Concha Se, MD 09/23/20 [phone]

## 2020-09-23 NOTE — ED Notes (Signed)
Patient transported to X-ray 

## 2020-09-23 NOTE — ED Triage Notes (Signed)
Pt presents to ER c/o left hip pain.  Pt states pain started while going to clean bathroom.  Pt states she heard something pop, and immediately began feeling the pain.  Pt denies doing anything to injure hip.

## 2020-09-23 NOTE — ED Notes (Signed)
Pt refused DG "I can't lat flat on that [left] side"  EDP to be contacted

## 2020-10-18 ENCOUNTER — Other Ambulatory Visit: Payer: Self-pay | Admitting: Physician Assistant

## 2020-10-24 ENCOUNTER — Other Ambulatory Visit (HOSPITAL_COMMUNITY): Payer: Self-pay | Admitting: Internal Medicine

## 2020-10-24 ENCOUNTER — Other Ambulatory Visit: Payer: Self-pay | Admitting: Internal Medicine

## 2020-10-24 DIAGNOSIS — R262 Difficulty in walking, not elsewhere classified: Secondary | ICD-10-CM

## 2020-10-24 DIAGNOSIS — M25552 Pain in left hip: Secondary | ICD-10-CM

## 2020-11-05 ENCOUNTER — Ambulatory Visit: Payer: BC Managed Care – PPO

## 2020-11-05 ENCOUNTER — Ambulatory Visit
Admission: RE | Admit: 2020-11-05 | Discharge: 2020-11-05 | Disposition: A | Discharge status: Home / Self Care | Payer: BC Managed Care – PPO | Source: Ambulatory Visit | Attending: Internal Medicine | Admitting: Internal Medicine

## 2020-11-05 ENCOUNTER — Other Ambulatory Visit: Payer: Self-pay

## 2020-11-05 DIAGNOSIS — M25552 Pain in left hip: Secondary | ICD-10-CM | POA: Insufficient documentation

## 2020-11-05 DIAGNOSIS — R262 Difficulty in walking, not elsewhere classified: Secondary | ICD-10-CM | POA: Diagnosis present

## 2021-04-21 IMAGING — CT CT ABD-PELV W/ CM
2 of 5 series · 16 of 46 positions shown, 18 images · IV contrast (APPLIED)
Comparison: Abdominal ultrasound dated 11/11/2017.

CLINICAL DATA: 58-year-old female with right lower quadrant
abdominal pain.

EXAM:
CT ABDOMEN AND PELVIS WITH CONTRAST
TECHNIQUE: Multidetector CT imaging of the abdomen and pelvis was performed
using the standard protocol following bolus administration of
intravenous contrast.
CONTRAST:  100mL OMNIPAQUE IOHEXOL 300 MG/ML  SOLN

[Series 2: routine abd/pel with · axial · 0.80mm/px · z∈[-879,-494]mm · 13 of 87 slices shown, 15 images]
[im 5/87  soft-tissue]
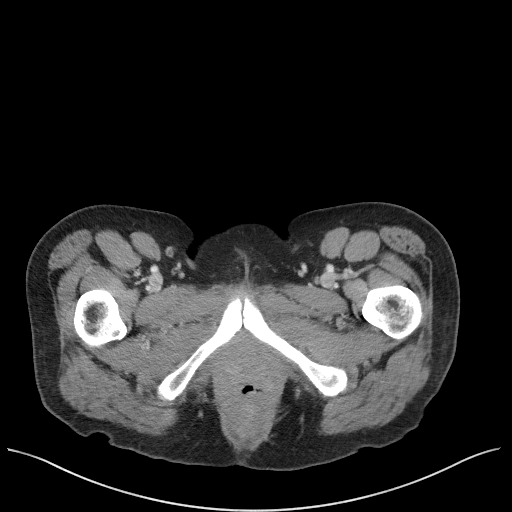
[im 5/87  bone]
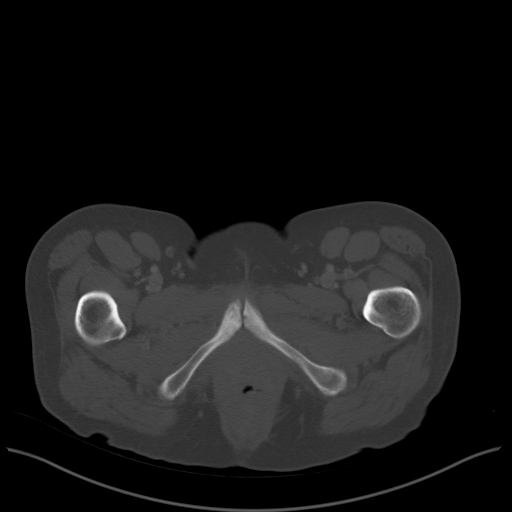
[im 10/87  soft-tissue]
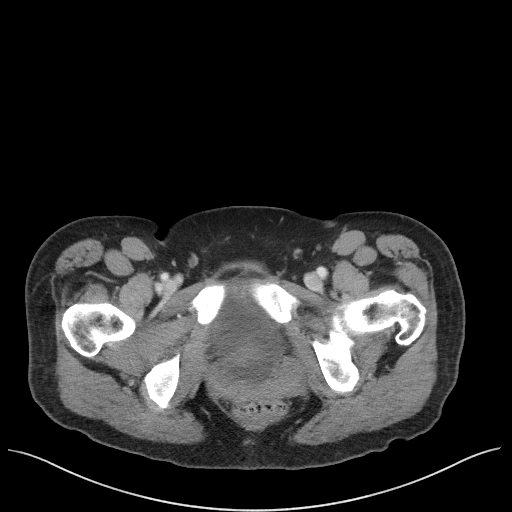
[im 20/87  soft-tissue]
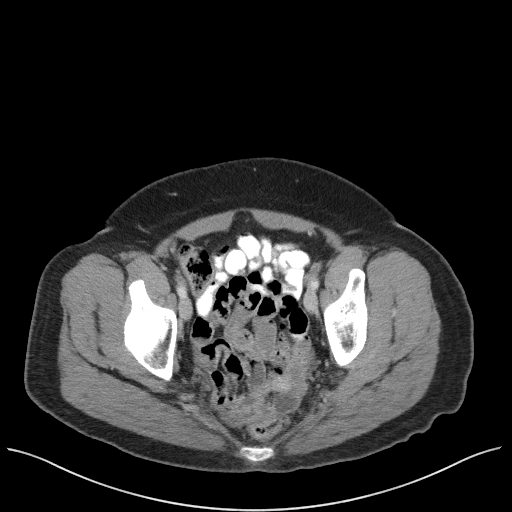
[im 24/87  soft-tissue]
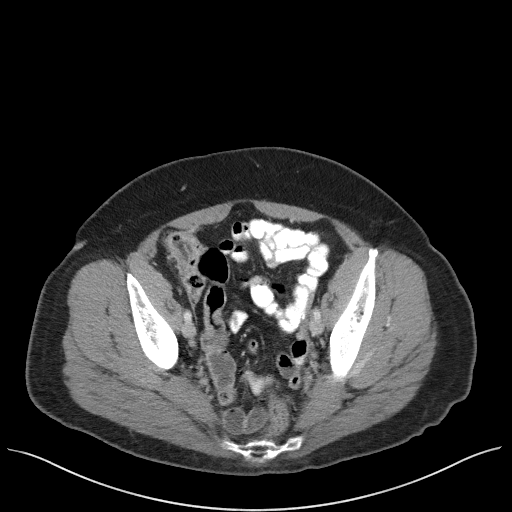
[im 29/87  soft-tissue]
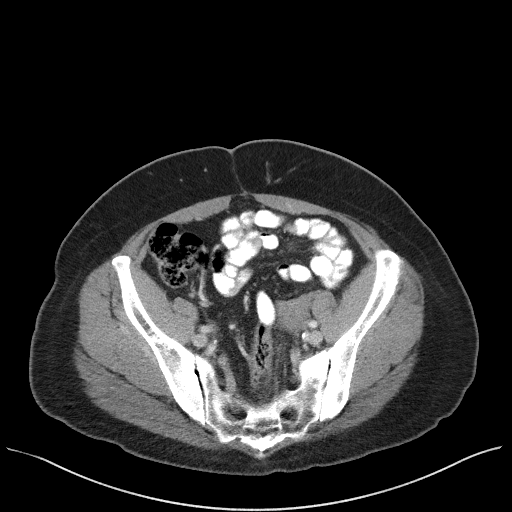
[im 39/87  soft-tissue]
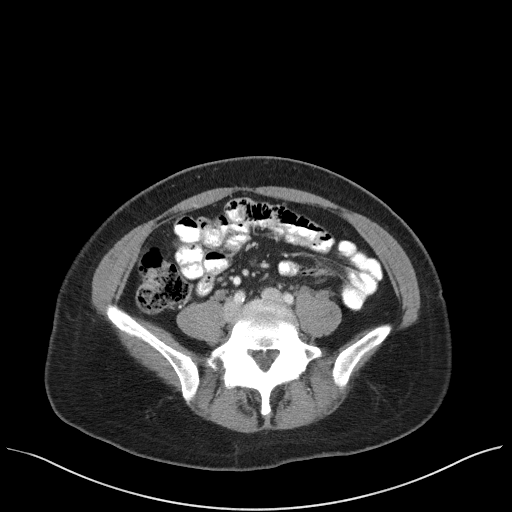
[im 44/87  soft-tissue]
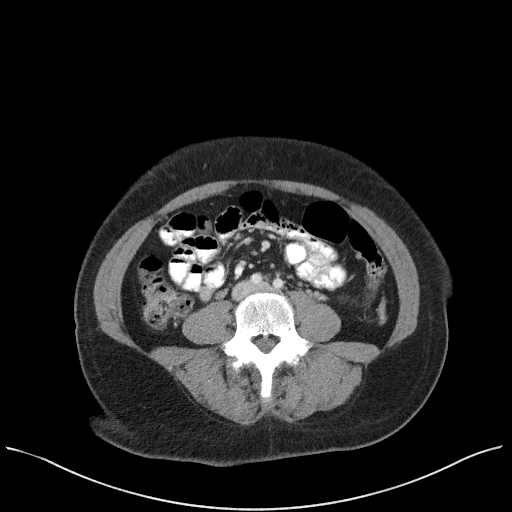
[im 48/87  soft-tissue]
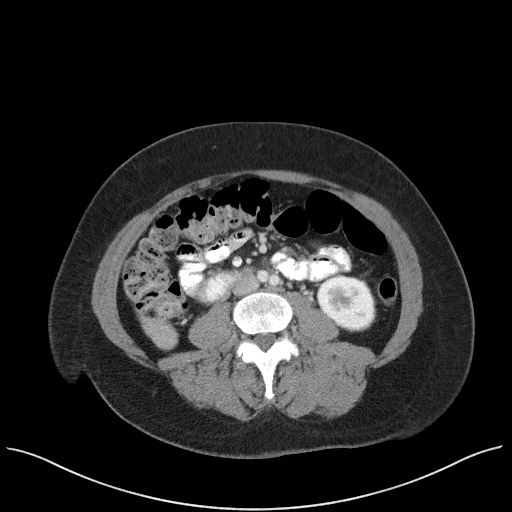
[im 58/87  soft-tissue]
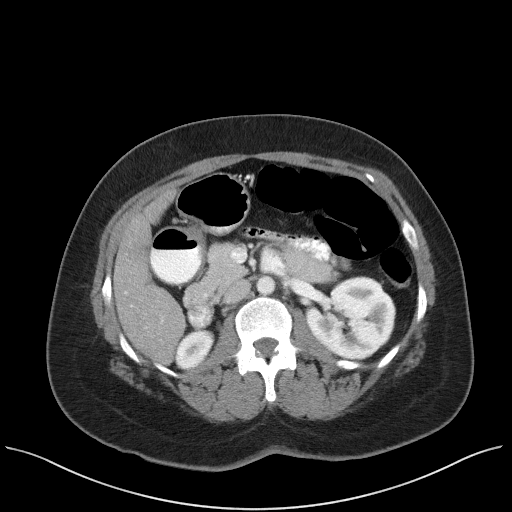
[im 58/87  bone]
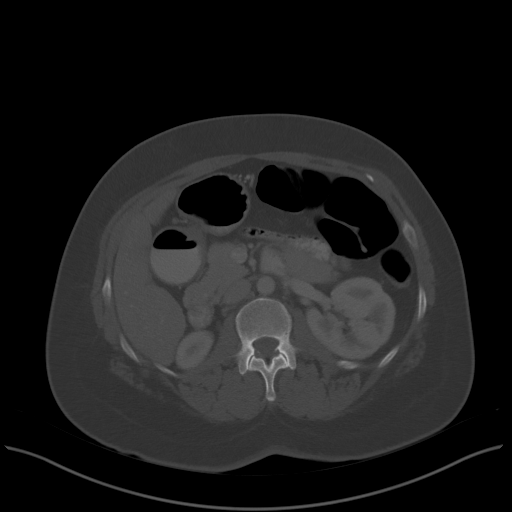
[im 63/87  soft-tissue]
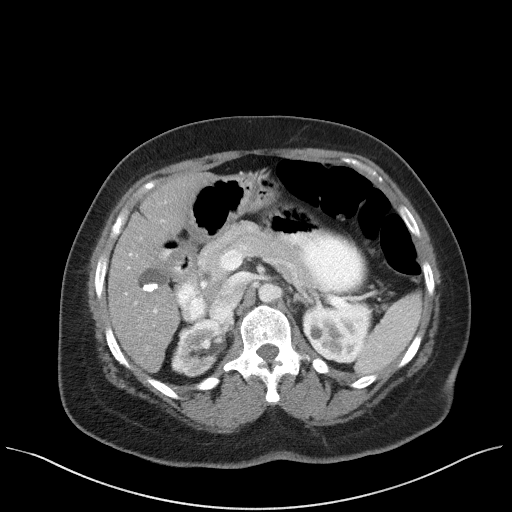
[im 67/87  soft-tissue]
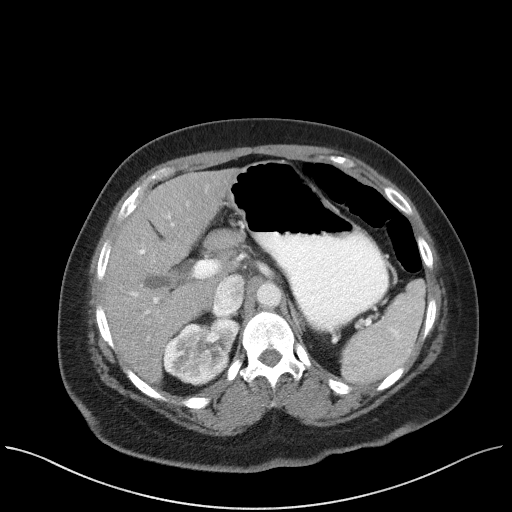
[im 77/87  soft-tissue]
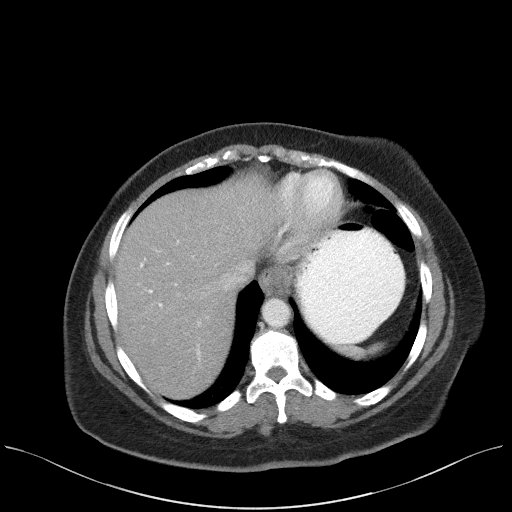
[im 82/87  soft-tissue]
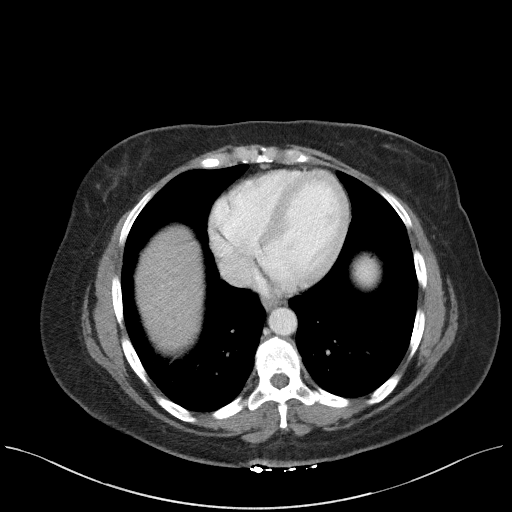

[Series 5: coronal st · coronal · 0.74mm/px · 3 of 89 slices shown]
[im 30/89  soft-tissue]
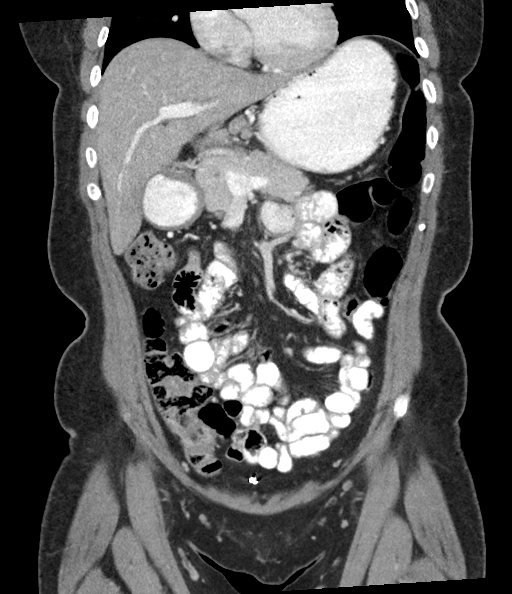
[im 40/89  soft-tissue]
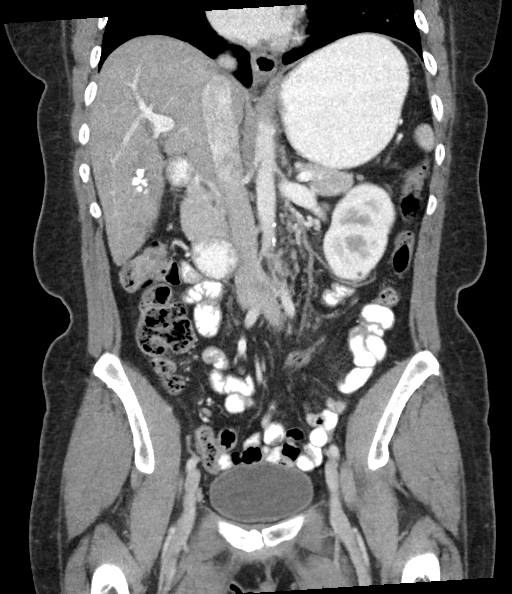
[im 49/89  soft-tissue]
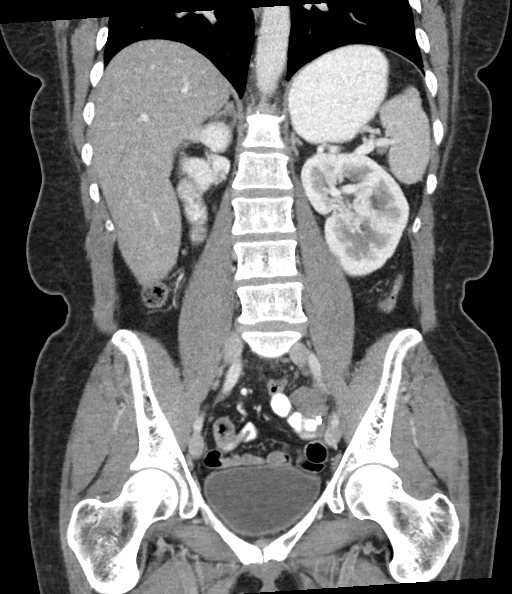

[16 of 46 positions shown; findings below may reference images not displayed]

FINDINGS: Lower chest: The visualized lung bases are clear.

No intra-abdominal free air or free fluid.

Hepatobiliary: Apparent fatty infiltration of the liver. No
intrahepatic biliary ductal dilatation. There multiple gallstones.
No pericholecystic fluid or evidence of acute cholecystitis by CT.

Pancreas: Unremarkable. No pancreatic ductal dilatation or
surrounding inflammatory changes.

Spleen: Normal in size without focal abnormality.

Adrenals/Urinary Tract: The adrenal glands are unremarkable. Right
renal parenchyma atrophy with areas of old infarct and scarring. The
left kidney is unremarkable. There is no hydronephrosis on either
side. There is symmetric enhancement and excretion of contrast by
both kidneys. Subcentimeter left renal hypodense lesions are too
small to characterize. The visualized ureters and urinary bladder
appear unremarkable.

Stomach/Bowel: Nodular soft tissue thickening at the anal orifice is
not well evaluated but may represent hemorrhoids. Clinical
correlation is recommended. There is no bowel obstruction or active
inflammation. The appendix is normal.

Vascular/Lymphatic: The abdominal aorta and IVC are unremarkable. No
portal venous gas. There is no adenopathy.

Reproductive: Hysterectomy. No adnexal masses.

Other: None

Musculoskeletal: Lower lumbar facet arthropathy. No acute osseous
pathology.
IMPRESSION: 1. No acute intra-abdominal or pelvic pathology. No bowel
obstruction or active inflammation. Normal appendix.
2. Cholelithiasis.
3. Fatty liver.
4. Right renal atrophy with areas of old infarct and scarring.
5. Nodular soft tissue thickening at the anal orifice is not well
evaluated but may represent hemorrhoids. Clinical correlation is
recommended.

## 2021-12-03 ENCOUNTER — Ambulatory Visit (INDEPENDENT_AMBULATORY_CARE_PROVIDER_SITE_OTHER): Payer: BC Managed Care – PPO

## 2021-12-03 ENCOUNTER — Ambulatory Visit
Admission: EM | Admit: 2021-12-03 | Discharge: 2021-12-03 | Disposition: A | Discharge status: Home / Self Care | Payer: BC Managed Care – PPO | Attending: Internal Medicine | Admitting: Internal Medicine

## 2021-12-03 DIAGNOSIS — M79675 Pain in left toe(s): Secondary | ICD-10-CM | POA: Diagnosis not present

## 2021-12-03 DIAGNOSIS — S92515A Nondisplaced fracture of proximal phalanx of left lesser toe(s), initial encounter for closed fracture: Secondary | ICD-10-CM

## 2021-12-03 NOTE — ED Provider Notes (Signed)
[facility]    CSN: [id number] Arrival date & time: 12/03/21  1143      History   Chief Complaint Chief Complaint  Patient presents with   Toe Injury    HPI Teresa Vaughn is a 61 y.o. female. She stubbed her left small toe on the laundry room door jamb a week ago, hurting a little more than she expected it to the last couple nights.  Able to walk, wearing some athletic slides.    HPI  Past Medical History:  Diagnosis Date   Hypercholesteremia    Hypertension    PONV (postoperative nausea and vomiting)     There are no problems to display for this patient.   Past Surgical History:  Procedure Laterality Date   ABDOMINAL HYSTERECTOMY     CHOLECYSTECTOMY     COLONOSCOPY WITH PROPOFOL N/A 05/26/2019   Procedure: COLONOSCOPY WITH PROPOFOL;  Surgeon: Sung Amabile, DO;  Location: [facility];  Service: General;  Laterality: N/A;   TONSILLECTOMY      OB History   No obstetric history on file.      Home Medications    Prior to Admission medications   Medication Sig Start Date End Date Taking? Authorizing Provider  amLODipine (NORVASC) 5 MG tablet Take 5 mg by mouth daily.  08/24/17 07/29/19  [provider]  atorvastatin (LIPITOR) 40 MG tablet Take 40 mg by mouth daily.    [provider]  fenofibrate (TRICOR) 48 MG tablet Take 1 tablet by mouth daily. 12/03/18   [provider]  ibuprofen (ADVIL) 600 MG tablet Take 1 tablet (600 mg total) by mouth every 8 (eight) hours as needed. 10/29/19   Joni Reining, PA-C  meloxicam (MOBIC) 15 MG tablet Take 1 tablet (15 mg total) by mouth daily. 09/21/20   Joni Reining, PA-C  vitamin B-12 (CYANOCOBALAMIN) 1000 MCG tablet Take 1,000 mcg by mouth daily.     [provider]  losartan-hydrochlorothiazide (HYZAAR) 100-25 MG tablet TAKE 1 TABLET BY MOUTH ONCE DAILY. 10/22/17 07/29/19  [provider]    Family History Family History  Problem Relation Age of Onset    Breast cancer Paternal Aunt 52   Hypertension Mother    Liver cancer Mother    Hypertension Father    Bone cancer Father     Social History Social History   Tobacco Use   Smoking status: Every Day    Packs/day: 0.50    Types: Cigarettes    Start date: 07/07/1989   Smokeless tobacco: Never  Vaping Use   Vaping Use: Never used  Substance Use Topics   Alcohol use: Yes    Alcohol/week: 7.0 standard drinks of alcohol    Types: 7 Standard drinks or equivalent per week   Drug use: Never     Allergies   Lisinopril and Penicillins   Review of Systems Review of Systems   Physical Exam Triage Vital Signs ED Triage Vitals [12/03/21 1245]  Enc Vitals Group     BP (!) 194/110     Pulse Rate 83     Resp 18     Temp 98.3 F (36.8 C)     Temp Source Oral     SpO2 97 %     Weight      Height      Head Circumference      Peak Flow      Pain Score      Pain Loc  Pain Edu?      Excl. in GC?    No data found.  Updated Vital Signs BP (!) 194/110 (BP Location: Left Arm)   Pulse 83   Temp 98.3 F (36.8 C) (Oral)   Resp 18   LMP 07/07/1997 (LMP Unknown) Comment: HAd hysterectomy unsure of date  SpO2 97%   Visual Acuity Right Eye Distance:   Left Eye Distance:   Bilateral Distance:    Right Eye Near:   Left Eye Near:    Bilateral Near:     Physical Exam Constitutional:      General: She is not in acute distress.    Appearance: She is not ill-appearing.     Comments: Good hygiene  HENT:     Head: Atraumatic.     Mouth/Throat:     Mouth: Mucous membranes are moist.  Eyes:     Conjunctiva/sclera:     Right eye: Right conjunctiva is not injected. No exudate.    Left eye: Left conjunctiva is not injected. No exudate.    Comments: Conjugate gaze observed  Cardiovascular:     Rate and Rhythm: Normal rate.  Pulmonary:     Effort: Pulmonary effort is normal. No respiratory distress.  Abdominal:     General: There is no distension.  Musculoskeletal:      Cervical back: Neck supple.     Comments: Walked into the urgent care Foot not currently bruised/swollen; painful area is in proximal left 5th toe.  Skin:    General: Skin is warm and dry.  Neurological:     Mental Status: She is alert.     Comments: Face symmetric, speech clear/coherent/logical      UC Treatments / Results  Labs (all labs ordered are listed, but only abnormal results are displayed) Labs Reviewed - No data to display  EKG   Radiology DG Toe 5th Left  Result Date: 12/03/2021 CLINICAL DATA:  Left fifth toe pain.  Stubbed toe 1 week ago. EXAM: DG TOE 5TH LEFT COMPARISON:  Left foot radiographs 07/29/2019 FINDINGS: There is mild curvilinear lucency within the distal metaphysis of the proximal phalanx of fifth toe on the final (lateral) image, indicating an acute nondisplaced fracture. Minimal cortical displacement at the lateral aspect of the distal metaphysis on the first (frontal) view. No dislocation. IMPRESSION: Acute nondisplaced fracture of the distal aspect of the proximal phalanx of the fifth toe. No definite intra-articular extension. Electronically Signed   By: Neita Garnet M.D.   On: 12/03/2021 13:41    Procedures Procedures (including critical care time)  Medications Ordered in UC Medications - No data to display  Initial Impression / Assessment and Plan / UC Course  I have reviewed the triage vital signs and the nursing notes.  Pertinent labs & imaging results that were available during my care of the patient were reviewed by me and considered in my medical decision making (see chart for details).     *** Final Clinical Impressions(s) / UC Diagnoses   Final diagnoses:  None   Discharge Instructions   None    ED Prescriptions   None    PDMP not reviewed this encounter.

## 2021-12-03 NOTE — ED Triage Notes (Signed)
Onset 1 week ago pt stomp her left foot pinky toe. Pt reports some discoloration on her toe.

## 2021-12-03 NOTE — Discharge Instructions (Signed)
Xray today showed a fracture of the left small toe.  Wear post op shoe to limit movement of the toe while you are walking and help with healing and pain.  Take tylenol or ibuprofen orc as needed for pain. Ice or heat, 10-15 minutes several times daily to painful area, may help reduce pain.  Anticipate improvement in pain over the next 4-6 weeks.  Recheck or followup with a podiatrist, sports medicine care provider or your primary care provider if not improving as expected.

## 2022-05-02 ENCOUNTER — Other Ambulatory Visit: Payer: Self-pay

## 2022-05-02 DIAGNOSIS — Z1231 Encounter for screening mammogram for malignant neoplasm of breast: Secondary | ICD-10-CM

## 2022-05-06 ENCOUNTER — Ambulatory Visit
Admission: RE | Admit: 2022-05-06 | Discharge: 2022-05-06 | Disposition: A | Discharge status: Home / Self Care | Payer: BC Managed Care – PPO | Source: Ambulatory Visit | Attending: Internal Medicine | Admitting: Internal Medicine

## 2022-05-06 DIAGNOSIS — Z1231 Encounter for screening mammogram for malignant neoplasm of breast: Secondary | ICD-10-CM

## 2022-05-20 ENCOUNTER — Other Ambulatory Visit: Payer: Self-pay | Admitting: Internal Medicine

## 2022-05-20 DIAGNOSIS — N6310 Unspecified lump in the right breast, unspecified quadrant: Secondary | ICD-10-CM

## 2022-06-24 ENCOUNTER — Ambulatory Visit
Admission: RE | Admit: 2022-06-24 | Discharge: 2022-06-24 | Disposition: A | Discharge status: Home / Self Care | Payer: BC Managed Care – PPO | Source: Ambulatory Visit | Attending: Internal Medicine | Admitting: Internal Medicine

## 2022-06-24 ENCOUNTER — Other Ambulatory Visit: Payer: Self-pay | Admitting: Internal Medicine

## 2022-06-24 DIAGNOSIS — N6310 Unspecified lump in the right breast, unspecified quadrant: Secondary | ICD-10-CM

## 2022-06-30 ENCOUNTER — Ambulatory Visit
Admission: RE | Admit: 2022-06-30 | Discharge: 2022-06-30 | Disposition: A | Discharge status: Home / Self Care | Payer: BC Managed Care – PPO | Source: Ambulatory Visit | Attending: Internal Medicine | Admitting: Internal Medicine

## 2022-06-30 DIAGNOSIS — N6310 Unspecified lump in the right breast, unspecified quadrant: Secondary | ICD-10-CM

## 2022-06-30 HISTORY — PX: BREAST BIOPSY: SHX20

## 2022-12-08 ENCOUNTER — Other Ambulatory Visit: Payer: Self-pay | Admitting: Internal Medicine

## 2022-12-08 DIAGNOSIS — R7989 Other specified abnormal findings of blood chemistry: Secondary | ICD-10-CM

## 2022-12-09 ENCOUNTER — Ambulatory Visit
Admission: RE | Admit: 2022-12-09 | Discharge: 2022-12-09 | Disposition: A | Discharge status: Home / Self Care | Payer: BC Managed Care – PPO | Source: Ambulatory Visit | Attending: Internal Medicine | Admitting: Internal Medicine

## 2022-12-09 DIAGNOSIS — R7989 Other specified abnormal findings of blood chemistry: Secondary | ICD-10-CM | POA: Diagnosis present

## 2023-08-27 ENCOUNTER — Other Ambulatory Visit: Payer: Self-pay | Admitting: Obstetrics and Gynecology

## 2023-08-27 ENCOUNTER — Other Ambulatory Visit: Payer: Self-pay

## 2023-08-27 DIAGNOSIS — N6310 Unspecified lump in the right breast, unspecified quadrant: Secondary | ICD-10-CM

## 2023-08-27 DIAGNOSIS — Z1231 Encounter for screening mammogram for malignant neoplasm of breast: Secondary | ICD-10-CM

## 2023-08-27 DIAGNOSIS — N6323 Unspecified lump in the left breast, lower outer quadrant: Secondary | ICD-10-CM

## 2023-10-12 ENCOUNTER — Ambulatory Visit
Admission: RE | Admit: 2023-10-12 | Discharge: 2023-10-12 | Disposition: A | Discharge status: Home / Self Care | Payer: Self-pay | Source: Ambulatory Visit | Attending: Obstetrics and Gynecology | Admitting: Obstetrics and Gynecology

## 2023-10-12 ENCOUNTER — Ambulatory Visit: Payer: Self-pay | Attending: Obstetrics and Gynecology | Admitting: *Deleted

## 2023-10-12 VITALS — BP 177/84 | Wt 166.7 lb

## 2023-10-12 DIAGNOSIS — Z1239 Encounter for other screening for malignant neoplasm of breast: Secondary | ICD-10-CM

## 2023-10-12 DIAGNOSIS — Z1231 Encounter for screening mammogram for malignant neoplasm of breast: Secondary | ICD-10-CM

## 2023-10-12 DIAGNOSIS — N6310 Unspecified lump in the right breast, unspecified quadrant: Secondary | ICD-10-CM

## 2023-10-12 NOTE — Progress Notes (Signed)
 [name] is a 63 y.o. female who presents to [facility] clinic today with no complaints. Patient had a right breast biopsy completed 06/30/2022 that 42-month diagnostic mammogram was recommended for follow up.   Pap Smear: Pap smear not completed today. Last Pap smear was 07/16/2017 at [facility] clinic and was normal. Per patient has no history of an abnormal Pap smear. Patient has a history of a hysterectomy due to benign reasons. Patient doesn't need any further Pap smears due to her history of a hysterectomy for benign reasons. Last Pap smear result is available in Epic.   Physical exam: Breasts Right breast greater than left breast that per patient is normal for her. No skin abnormalities bilateral breasts. No nipple retraction bilateral breasts. No nipple discharge bilateral breasts. No lymphadenopathy. No lumps palpated bilateral breasts. No complaints of pain or tenderness on exam.  MM CLIP PLACEMENT RIGHT Result Date: 06/30/2022 CLINICAL DATA:  Status post ultrasound-guided needle biopsies of a recently demonstrated 6 mm mass in the 4 o'clock position of the right breast, 2 cm from the nipple marked with a ribbon shaped clip and a 4 mm mass in the 4 o'clock position of the right breast, 1 cm from the nipple marked with a heart shaped clip. EXAM: 3D DIAGNOSTIC RIGHT MAMMOGRAM POST ULTRASOUND BIOPSY X 2 COMPARISON:  Previous exam(s). FINDINGS: 3D Mammographic images were obtained following ultrasound guided biopsy of 2 masses in the 4 o'clock position of the right breast, detailed above. The biopsy marking clips are in expected positions at the sites of biopsy. The clips are located 2.6 cm apart. IMPRESSION: Appropriate positioning of the ribbon shaped biopsy marking clip at the site of biopsy in the 4 o'clock position of the right breast, 2 cm from the nipple and appropriate positioning of the heart shaped clip in the 4 o'clock position, 1 cm from the nipple. These correspond to 2 of the more  anterior masses seen mammographically. The more posterior masses are located 1.7 cm posterior to the biopsied mass in the 4 o'clock position, 2 cm from the nipple. At least 1 of these was seen on the pre biopsy ultrasound and had an appearance similar to the biopsied mass in the 4 o'clock position, 2 cm from the nipple. If needed based on today's pathology results these posterior masses are in a cluster and could be best biopsied under 3D stereotactic guidance with a single biopsy targeted to the central portion of the clustered masses. Final Assessment: Post Procedure Mammograms for Marker Placement Electronically Signed   By: Catherin Closs M.D.   On: 06/30/2022 12:03  MM DIAG BREAST TOMO UNI RIGHT Result Date: 06/24/2022 CLINICAL DATA:  63 year old female recalled from screening mammogram dated 05/06/2022 for possible right breast masses. EXAM: DIGITAL DIAGNOSTIC UNILATERAL RIGHT MAMMOGRAM WITH TOMOSYNTHESIS; ULTRASOUND RIGHT BREAST LIMITED TECHNIQUE: Right digital diagnostic mammography and breast tomosynthesis was performed.; Targeted ultrasound examination of the right breast was performed COMPARISON:  Previous exam(s). ACR Breast Density Category b: There are scattered areas of fibroglandular density. FINDINGS: There are persistent oval, circumscribed and lobulated masses within the lower inner quadrant of the right breast. Further evaluation with ultrasound was performed. Targeted ultrasound is performed, showing 2 slightly irregular hypoechoic masses with associated vascularity at the 4 o'clock position 1 and 2 cm from the nipple. They measure 4 x 4 x 3 mm and 6 x 5 x 4 mm respectively. These likely correspond with the mammographic finding. Evaluation of the right axilla demonstrates no suspicious lymphadenopathy.  IMPRESSION: 1. Indeterminate right breast masses at the 4 o'clock position. Recommend ultrasound-guided biopsy. 2. No suspicious right axillary lymphadenopathy. RECOMMENDATION: 1. Two area  ultrasound-guided biopsy of the right breast. 2. Recommend close attention on post clip films to ensure mammographic correlation. If final recommendation will be made once the biopsy is performed and pathology is made available. Options include follow-up verse biopsies of the additional masses identified mammographically. I have discussed the findings and recommendations with the patient. If applicable, a reminder letter will be sent to the patient regarding the next appointment. BI-RADS CATEGORY  4: Suspicious. Electronically Signed   By: Serena  Chacko M.D.   On: 06/24/2022 10:20  MM 3D SCREEN BREAST BILATERAL Result Date: 05/19/2022 CLINICAL DATA:  Screening. EXAM: DIGITAL SCREENING BILATERAL MAMMOGRAM WITH TOMOSYNTHESIS AND CAD TECHNIQUE: Bilateral screening digital craniocaudal and mediolateral oblique mammograms were obtained. Bilateral screening digital breast tomosynthesis was performed. The images were evaluated with computer-aided detection. COMPARISON:  Previous exam(s). ACR Breast Density Category b: There are scattered areas of fibroglandular density. FINDINGS: In the right breast, a possible mass warrants further evaluation. In the left breast, no findings suspicious for malignancy. IMPRESSION: Further evaluation is suggested for a possible mass in the right breast. RECOMMENDATION: Diagnostic mammogram and possibly ultrasound of the right breast. (Code:FI-R-14M) The patient will be contacted regarding the findings, and additional imaging will be scheduled. BI-RADS CATEGORY  0: Incomplete. Need additional imaging evaluation and/or prior mammograms for comparison. Electronically Signed   By: Anitra Barn M.D.   On: 05/19/2022 09:05   MM 3D SCREEN BREAST BILATERAL Result Date: 04/27/2019 CLINICAL DATA:  Screening. EXAM: DIGITAL SCREENING BILATERAL MAMMOGRAM WITH TOMO AND CAD COMPARISON:  Previous exam(s). ACR Breast Density Category b: There are scattered areas of fibroglandular density.  FINDINGS: There are no findings suspicious for malignancy. Images were processed with CAD. IMPRESSION: No mammographic evidence of malignancy. A result letter of this screening mammogram will be mailed directly to the patient. RECOMMENDATION: Screening mammogram in one year. (Code:[id number]-B-01Y) BI-RADS CATEGORY  1: Negative. Electronically Signed   By: Jone Neither M.D.   On: 04/27/2019 14:21       Pelvic/Bimanual Pap is not indicated today per BCCCP guidelines.   Smoking History: Patient is a current smoker. Discussed smoking cessation with patient. Referred to the [facility] Quitline.   Patient Navigation: Patient education provided. Access to services provided for patient through [facility] program.   Colorectal Cancer Screening: Per patient has had colonoscopy completed on 05/26/2019 at [facility].  No complaints today.    Breast and Cervical Cancer Risk Assessment: Patient has family history of a paternal aunt having breast cancer. Patient has no known genetic mutations or history of radiation treatment to the chest before age 17. Patient does not have history of cervical dysplasia, immunocompromised, or DES exposure in-utero.  Risk Scores as of Encounter on 10/12/2023     Gregary Lean           5-year 1.6%   Lifetime 7.17%            Last calculated by Algie Ingle, LPN on 1/61/0960 at 12:50 PM        A: BCCCP exam without pap smear No complaints.  P: Referred patient to the [facility] for a diagnostic mammogram per recommendation. Appointment scheduled Monday, October 12, 2023 at 1340.  Stefan Edge, RN 10/12/2023 1:24 PM

## 2023-10-12 NOTE — Patient Instructions (Addendum)
 Explained breast self awareness with Sharlon Deacon. Patient did not need a Pap smear today due to patient has a history of a hysterectomy for benign reasons. Let her know that she doesn't need any further Pap smears due to her history of a hysterectomy for benign reasons. Referred patient to the [facility] for a diagnostic mammogram per recommendation. Appointment scheduled Monday, October 12, 2023 at 1340. Patient aware of appointment and will be there. Teresa Vaughn verbalized understanding.  Daune Colgate, Dela Favor, RN 1:25 PM

## 2023-10-14 ENCOUNTER — Other Ambulatory Visit: Payer: Self-pay | Admitting: Obstetrics and Gynecology

## 2023-10-14 DIAGNOSIS — N6310 Unspecified lump in the right breast, unspecified quadrant: Secondary | ICD-10-CM

## 2024-03-10 ENCOUNTER — Other Ambulatory Visit: Payer: Self-pay | Admitting: Internal Medicine

## 2024-03-10 DIAGNOSIS — R7989 Other specified abnormal findings of blood chemistry: Secondary | ICD-10-CM

## 2024-03-23 ENCOUNTER — Ambulatory Visit
Admission: RE | Admit: 2024-03-23 | Discharge: 2024-03-23 | Disposition: A | Discharge status: Home / Self Care | Payer: Self-pay | Source: Ambulatory Visit | Attending: Internal Medicine | Admitting: Internal Medicine

## 2024-03-23 DIAGNOSIS — R7989 Other specified abnormal findings of blood chemistry: Secondary | ICD-10-CM | POA: Insufficient documentation

## 2024-03-23 MED ORDER — IOHEXOL 300 MG/ML  SOLN
100.0000 mL | Freq: Once | INTRAMUSCULAR | Status: AC | PRN
  Administered 2024-03-23: 100 mL via INTRAVENOUS

## 2024-04-26 ENCOUNTER — Emergency Department
Admission: EM | Admit: 2024-04-26 | Discharge: 2024-04-26 | Disposition: A | Discharge status: Home / Self Care | Payer: Self-pay | Attending: Emergency Medicine | Admitting: Emergency Medicine

## 2024-04-26 ENCOUNTER — Emergency Department: Payer: Self-pay

## 2024-04-26 ENCOUNTER — Other Ambulatory Visit: Payer: Self-pay

## 2024-04-26 DIAGNOSIS — M791 Myalgia, unspecified site: Secondary | ICD-10-CM

## 2024-04-26 DIAGNOSIS — M79672 Pain in left foot: Secondary | ICD-10-CM | POA: Insufficient documentation

## 2024-04-26 DIAGNOSIS — R600 Localized edema: Secondary | ICD-10-CM | POA: Insufficient documentation

## 2024-04-26 DIAGNOSIS — W19XXXA Unspecified fall, initial encounter: Secondary | ICD-10-CM | POA: Insufficient documentation

## 2024-04-26 MED ORDER — CYCLOBENZAPRINE HCL 5 MG PO TABS: 5.0000 mg | ORAL_TABLET | Freq: Three times a day (TID) | ORAL | 0 refills | Status: AC | PRN

## 2024-04-26 NOTE — ED Notes (Signed)
 See triage note  Presents s/p fall on Saturday  States she fell over some cases of water  Having pain to left knee and left foot  States pain to foot is  mainly at 4th and 5 th toes

## 2024-04-26 NOTE — Discharge Instructions (Addendum)
 Please take Tylenol  or ibuprofen  as written on the box as needed for discomfort.  You may also take your prescribed Flexeril in addition to these medications if needed for more significant pain.  Do not drink alcohol or drive while taking this medication.  Return to the emergency department for any symptom personally concerning to yourself otherwise please follow-up with your primary care doctor.

## 2024-04-26 NOTE — ED Triage Notes (Signed)
 Pt had a fall on Saturday and injured her left knee and toes. Still having pain and states her toes were swollen this morning. Able to ambulate and wiggle toes.

## 2024-04-26 NOTE — ED Provider Notes (Signed)
-----------------------------------------   4:03 PM on 04/26/2024 -----------------------------------------  Patient care assumed from Dr. Floy.  Patient had had x-ray imaging performed of the left knee and foot which resulted negative besides some mild soft tissue swelling of the foot.  Ultrasound was performed which I followed up on showing no evidence of DVT.  Highly suspect more muscular type pain.  Will place the patient on a short course of Flexeril in addition to over-the-counter Tylenol  or ibuprofen .  Patient agreeable to plan of care.   Dorothyann Drivers, MD 04/26/24 [phone]

## 2024-05-08 NOTE — ED Provider Notes (Signed)
 [facility] Provider Note    Event Date/Time   First MD Initiated Contact with Patient 04/26/24 1518     (approximate)   History   Fall   HPI  Teresa Vaughn is a 63 y.o. female who presents to the emergency department today because of concerns for left toe pain.  Patient states that she had a fall 3 days ago and fell and injured her left knee and foot.  Since then she has continued to have pain to her left foot and toes and has noticed some swelling.  She denies any fevers or other signs of illness.     Physical Exam   Triage Vital Signs: ED Triage Vitals  Encounter Vitals Group     BP 04/26/24 1255 (!) 152/95     Girls Systolic BP Percentile --      Girls Diastolic BP Percentile --      Boys Systolic BP Percentile --      Boys Diastolic BP Percentile --      Pulse Rate 04/26/24 1255 79     Resp 04/26/24 1255 18     Temp 04/26/24 1255 98.2 F (36.8 C)     Temp Source 04/26/24 1255 Oral     SpO2 04/26/24 1255 99 %     Weight 04/26/24 1318 166 lb 10.7 oz (75.6 kg)     Height 04/26/24 1318 5' 3 (1.6 m)     Head Circumference --      Peak Flow --      Pain Score 04/26/24 1251 7     Pain Loc --      Pain Education --      Exclude from Growth Chart --     Most recent vital signs: Vitals:   04/26/24 1255 04/26/24 1627  BP: (!) 152/95 (!) 148/90  Pulse: 79 70  Resp: 18 18  Temp: 98.2 F (36.8 C)   SpO2: 99% 99%   General: Awake, alert, oriented. CV:  Good peripheral perfusion.  Resp:  Normal effort.  Abd:  No distention.  Other:  Left foot with mild edema.   ED Results / Procedures / Treatments   Labs (all labs ordered are listed, but only abnormal results are displayed) Labs Reviewed - No data to display   EKG  None   RADIOLOGY I independently interpreted and visualized the left foot. My interpretation: No fracture Radiology interpretation:  IMPRESSION:  1. Soft tissue swelling of the forefoot.    I  independently interpreted and visualized the left knee. My interpretation: No fracture Radiology interpretation:  IMPRESSION:  1. No acute fracture or dislocation.  2. Osteopenia.     PROCEDURES:  Critical Care performed: No    MEDICATIONS ORDERED IN ED: Medications - No data to display   IMPRESSION / MDM / ASSESSMENT AND PLAN / ED COURSE  I reviewed the triage vital signs and the nursing notes.                              Differential diagnosis includes, but is not limited to, fracture, dislocation, contusion, DVT  Patient's presentation is most consistent with acute presentation with potential threat to life or bodily function.   Patient presented to the emergency department today because concerns for continued left foot pain and swelling.  On exam she has small amount of swelling to her foot.  But have concerns for fracture.  Additionally have concern  for possible DVT given swelling.  X-rays of the knee and foot without any concerning acute abnormalities.  Awaiting ultrasound at time of signout.     FINAL CLINICAL IMPRESSION(S) / ED DIAGNOSES   Left foot pain     Note:  This document was prepared using Dragon voice recognition software and may include unintentional dictation errors.    Floy Roberts, MD 05/08/24 [phone]
